# Patient Record
Sex: Female | Born: 1955
Health system: Southern US, Community
[De-identification: ages and names within clinical notes are randomized; demographics above are authoritative.]

## PROBLEM LIST (undated history)

## (undated) DIAGNOSIS — H43811 Vitreous degeneration, right eye: Secondary | ICD-10-CM

## (undated) DIAGNOSIS — T7840XA Allergy, unspecified, initial encounter: Secondary | ICD-10-CM

## (undated) DIAGNOSIS — E079 Disorder of thyroid, unspecified: Secondary | ICD-10-CM

## (undated) DIAGNOSIS — M25562 Pain in left knee: Secondary | ICD-10-CM

## (undated) HISTORY — DX: Vitreous degeneration, right eye: H43.811

## (undated) HISTORY — PX: FRACTURE SURGERY: SHX138

## (undated) HISTORY — DX: Pain in left knee: M25.562

## (undated) HISTORY — DX: Allergy, unspecified, initial encounter: T78.40XA

## (undated) HISTORY — DX: Disorder of thyroid, unspecified: E07.9

---

## 2004-07-15 ENCOUNTER — Ambulatory Visit: Payer: Self-pay | Admitting: Obstetrics and Gynecology

## 2005-07-04 ENCOUNTER — Ambulatory Visit: Payer: Self-pay | Admitting: Obstetrics and Gynecology

## 2006-08-23 ENCOUNTER — Ambulatory Visit: Payer: Self-pay | Admitting: Obstetrics and Gynecology

## 2006-10-05 ENCOUNTER — Ambulatory Visit: Payer: Self-pay | Admitting: Gastroenterology

## 2007-08-27 ENCOUNTER — Ambulatory Visit: Payer: Self-pay | Admitting: Obstetrics and Gynecology

## 2007-10-27 ENCOUNTER — Emergency Department (HOSPITAL_COMMUNITY): Admission: EM | Admit: 2007-10-27 | Discharge: 2007-10-27 | Payer: Self-pay | Admitting: Family Medicine

## 2008-08-27 ENCOUNTER — Ambulatory Visit: Payer: Self-pay | Admitting: Obstetrics and Gynecology

## 2009-04-28 ENCOUNTER — Other Ambulatory Visit: Admission: RE | Admit: 2009-04-28 | Discharge: 2009-04-28 | Payer: Self-pay | Admitting: Family Medicine

## 2010-07-27 ENCOUNTER — Ambulatory Visit: Payer: Self-pay | Admitting: Family Medicine

## 2011-08-01 ENCOUNTER — Ambulatory Visit: Payer: Self-pay | Admitting: Family Medicine

## 2012-03-14 ENCOUNTER — Other Ambulatory Visit (HOSPITAL_COMMUNITY)
Admission: RE | Admit: 2012-03-14 | Discharge: 2012-03-14 | Disposition: A | Discharge status: Home / Self Care | Payer: 59 | Source: Ambulatory Visit | Attending: Family Medicine | Admitting: Family Medicine

## 2012-03-14 DIAGNOSIS — Z1151 Encounter for screening for human papillomavirus (HPV): Secondary | ICD-10-CM | POA: Insufficient documentation

## 2012-03-14 DIAGNOSIS — Z124 Encounter for screening for malignant neoplasm of cervix: Secondary | ICD-10-CM | POA: Insufficient documentation

## 2012-08-01 ENCOUNTER — Ambulatory Visit: Payer: Self-pay | Admitting: Family Medicine

## 2013-03-10 ENCOUNTER — Ambulatory Visit: Payer: 59 | Admitting: Family Medicine

## 2013-03-13 ENCOUNTER — Ambulatory Visit (INDEPENDENT_AMBULATORY_CARE_PROVIDER_SITE_OTHER): Payer: 59 | Admitting: Internal Medicine

## 2013-03-13 ENCOUNTER — Other Ambulatory Visit (HOSPITAL_COMMUNITY)
Admission: RE | Admit: 2013-03-13 | Discharge: 2013-03-13 | Disposition: A | Discharge status: Home / Self Care | Payer: 59 | Source: Ambulatory Visit | Attending: Internal Medicine | Admitting: Internal Medicine

## 2013-03-13 ENCOUNTER — Encounter: Payer: Self-pay | Admitting: Internal Medicine

## 2013-03-13 ENCOUNTER — Other Ambulatory Visit: Payer: Self-pay | Admitting: Internal Medicine

## 2013-03-13 VITALS — BP 120/80 | HR 78 | Temp 98.4°F | Ht 67.0 in | Wt 173.5 lb

## 2013-03-13 DIAGNOSIS — E039 Hypothyroidism, unspecified: Secondary | ICD-10-CM

## 2013-03-13 DIAGNOSIS — Z01419 Encounter for gynecological examination (general) (routine) without abnormal findings: Secondary | ICD-10-CM | POA: Insufficient documentation

## 2013-03-13 DIAGNOSIS — Z0000 Encounter for general adult medical examination without abnormal findings: Secondary | ICD-10-CM

## 2013-03-13 DIAGNOSIS — Z124 Encounter for screening for malignant neoplasm of cervix: Secondary | ICD-10-CM

## 2013-03-13 DIAGNOSIS — Z13220 Encounter for screening for lipoid disorders: Secondary | ICD-10-CM

## 2013-03-13 DIAGNOSIS — Z131 Encounter for screening for diabetes mellitus: Secondary | ICD-10-CM

## 2013-03-13 DIAGNOSIS — M810 Age-related osteoporosis without current pathological fracture: Secondary | ICD-10-CM

## 2013-03-13 NOTE — Patient Instructions (Signed)

## 2013-03-13 NOTE — Progress Notes (Signed)
HPI  Pt presents to the clinic today to establish care. She is transferring care form [facility]. She has no concerns today.  Flu: never Tetanus: more than 10 years ago LMP: post menopausal Pap smear: 2011 Mammogram: 07/2012 Dexa Scan: 07/2012 Eye doctor: yearly Dentist: biannually  History reviewed. No pertinent past medical history.  Current Outpatient Prescriptions  Medication Sig Dispense Refill  . Ascorbic Acid (VITAMIN C) 1000 MG tablet Take 1,000 mg by mouth daily.      . cholecalciferol (VITAMIN D) 1000 UNITS tablet Take 1,000 Units by mouth daily.      . Coenzyme Q10 (COQ10) 100 MG CAPS Take 100 mg by mouth daily.      Marland Kitchen levothyroxine (SYNTHROID, LEVOTHROID) 100 MCG tablet Take 100 mcg by mouth. 1 tablet everyday and 1/2 tab on Sunday      . Omega-3 Fatty Acids (SUPER OMEGA 3 EPA/DHA) 1000 MG CAPS Take by mouth.      . raloxifene (EVISTA) 60 MG tablet Take 60 mg by mouth daily.       No current facility-administered medications for this visit.    Not on File  Family History  Problem Relation Age of Onset  . Diabetes Mother   . Cancer Father     prostate  . Cancer Sister     breast  . Stroke Brother     History   Social History  . Marital Status: Married    Spouse Name: N/A    Number of Children: N/A  . Years of Education: N/A   Occupational History  . Not on file.   Social History Main Topics  . Smoking status: Never Smoker   . Smokeless tobacco: Not on file  . Alcohol Use: No  . Drug Use: No  . Sexual Activity: Yes    Birth Control/ Protection: Post-menopausal   Other Topics Concern  . Not on file   Social History Narrative  . No narrative on file    ROS:  Constitutional: Denies fever, malaise, fatigue, headache or abrupt weight changes.  HEENT: Denies eye pain, eye redness, ear pain, ringing in the ears, wax buildup, runny nose, nasal congestion, bloody nose, or sore throat. Respiratory: Denies difficulty breathing, shortness of breath, cough  or sputum production.   Cardiovascular: Denies chest pain, chest tightness, palpitations or swelling in the hands or feet.  Gastrointestinal: Denies abdominal pain, bloating, constipation, diarrhea or blood in the stool.  GU: Denies frequency, urgency, pain with urination, blood in urine, odor or discharge. Musculoskeletal: Denies decrease in range of motion, difficulty with gait, muscle pain or joint pain and swelling.  Skin: Denies redness, rashes, lesions or ulcercations.  Neurological: Denies dizziness, difficulty with memory, difficulty with speech or problems with balance and coordination.   No other specific complaints in a complete review of systems (except as listed in HPI above).  PE:  BP 120/80  Pulse 78  Temp(Src) 98.4 F (36.9 C) (Oral)  Ht 5\' 7"  (1.702 m)  Wt 173 lb 8 oz (78.699 kg)  BMI 27.17 kg/m2  SpO2 98%  LMP 05/15/2005 Wt Readings from Last 3 Encounters:  03/13/13 173 lb 8 oz (78.699 kg)    General: Appears her stated age, well developed, well nourished in NAD. HEENT: Head: normal shape and size; Eyes: sclera white, no icterus, conjunctiva pink, PERRLA and EOMs intact; Ears: Tm's gray and intact, normal light reflex; Nose: mucosa pink and moist, septum midline; Throat/Mouth: Teeth present, mucosa pink and moist, no lesions or ulcerations noted.  Neck: Normal range of motion. Neck supple, trachea midline. No massses, lumps or thyromegaly present.  Cardiovascular: Normal rate and rhythm. S1,S2 noted.  No murmur, rubs or gallops noted. No JVD or BLE edema. No carotid bruits noted. Pulmonary/Chest: Normal effort and positive vesicular breath sounds. No respiratory distress. No wheezes, rales or ronchi noted.  Abdomen: Soft and nontender. Normal bowel sounds, no bruits noted. No distention or masses noted. Liver, spleen and kidneys non palpable. Musculoskeletal: Normal range of motion. No signs of joint swelling. No difficulty with gait.  Neurological: Alert and  oriented. Cranial nerves II-XII intact. Coordination normal. +DTRs bilaterally. Psychiatric: Mood and affect normal. Behavior is normal. Judgment and thought content normal.      Assessment and Plan:  Preventative Health:  Pt declines flu and Tdap today Will have labs done at [facility]- oredred today Pap smear obtained today  RTC in 1 year or sooner if needed

## 2013-03-17 ENCOUNTER — Telehealth: Payer: Self-pay

## 2013-03-17 NOTE — Telephone Encounter (Signed)
Message copied by Eulis Manly on Mon Mar 17, 2013  4:40 PM ------      Message from: Lorre Munroe      Created: Mon Mar 17, 2013  1:26 PM       Please call pt and let her know her pap was normal ------

## 2013-03-17 NOTE — Telephone Encounter (Signed)
Patient called and informed of test results. Patient stated that she was waiting on dates for lab work and she needed her levothyroxine refilled.   Please advise!   Thanks!

## 2013-03-18 ENCOUNTER — Other Ambulatory Visit: Payer: Self-pay | Admitting: *Deleted

## 2013-03-18 ENCOUNTER — Other Ambulatory Visit: Payer: Self-pay

## 2013-03-18 MED ORDER — LEVOTHYROXINE SODIUM 100 MCG PO TABS: ORAL_TABLET | ORAL | Status: DC

## 2013-03-18 NOTE — Telephone Encounter (Signed)
Mediatation refilled.

## 2013-03-18 NOTE — Telephone Encounter (Signed)
Ok to refill synthroid 

## 2013-04-30 ENCOUNTER — Encounter: Payer: Self-pay | Admitting: Internal Medicine

## 2013-04-30 ENCOUNTER — Encounter (HOSPITAL_COMMUNITY): Payer: Self-pay | Admitting: Emergency Medicine

## 2013-04-30 ENCOUNTER — Emergency Department (HOSPITAL_COMMUNITY)
Admission: EM | Admit: 2013-04-30 | Discharge: 2013-04-30 | Disposition: A | Discharge status: Home / Self Care | Payer: 59 | Source: Home / Self Care | Attending: Family Medicine | Admitting: Family Medicine

## 2013-04-30 ENCOUNTER — Emergency Department (INDEPENDENT_AMBULATORY_CARE_PROVIDER_SITE_OTHER): Payer: 59

## 2013-04-30 DIAGNOSIS — S62109A Fracture of unspecified carpal bone, unspecified wrist, initial encounter for closed fracture: Secondary | ICD-10-CM

## 2013-04-30 DIAGNOSIS — Y92009 Unspecified place in unspecified non-institutional (private) residence as the place of occurrence of the external cause: Secondary | ICD-10-CM

## 2013-04-30 DIAGNOSIS — S62101A Fracture of unspecified carpal bone, right wrist, initial encounter for closed fracture: Secondary | ICD-10-CM

## 2013-04-30 DIAGNOSIS — Y93E1 Activity, personal bathing and showering: Secondary | ICD-10-CM

## 2013-04-30 MED ORDER — HYDROCODONE-ACETAMINOPHEN 5-325 MG PO TABS: 1.0000 | ORAL_TABLET | Freq: Four times a day (QID) | ORAL | Status: DC | PRN

## 2013-04-30 NOTE — ED Notes (Signed)
C/o right wrist injury States she was getting out of the shower this afternoon and slipped when she fell back she tried to catch herself with her right hand States no treatment done Did put ice on wrist when she got here

## 2013-04-30 NOTE — ED Provider Notes (Signed)
CSN: [id number]     Arrival date & time 04/30/13  1309 History   None    Chief Complaint  Patient presents with  . Wrist Injury   (Consider location/radiation/quality/duration/timing/severity/associated sxs/prior Treatment) Patient is a 57 y.o. female presenting with hand injury. The history is provided by the patient.  Hand Injury Location:  Wrist Time since incident:  4 hours Injury: yes   Mechanism of injury: fall   Fall:    Fall occurred:  Standing (slipped getting out of the shower today)   Impact surface:  Hard floor Wrist location:  R wrist Pain details:    Quality:  Aching   Severity:  Mild Handedness:  Right-handed Prior injury to area:  No   History reviewed. No pertinent past medical history. History reviewed. No pertinent past surgical history. Family History  Problem Relation Age of Onset  . Diabetes Mother   . Cancer Father     prostate  . Cancer Sister     breast  . Stroke Brother    History  Substance Use Topics  . Smoking status: Never Smoker   . Smokeless tobacco: Not on file  . Alcohol Use: No   OB History   Grav Para Term Preterm Abortions TAB SAB Ect Mult Living                 Review of Systems  All other systems reviewed and are negative.    Allergies  Review of patient's allergies indicates no known allergies.  Home Medications   Current Outpatient Rx  Name  Route  Sig  Dispense  Refill  . Ascorbic Acid (VITAMIN C) 1000 MG tablet   Oral   Take 1,000 mg by mouth daily.         . cholecalciferol (VITAMIN D) 1000 UNITS tablet   Oral   Take 1,000 Units by mouth daily.         . Coenzyme Q10 (COQ10) 100 MG CAPS   Oral   Take 100 mg by mouth daily.         Marland Kitchen levothyroxine (SYNTHROID, LEVOTHROID) 100 MCG tablet      1 tablet everyday and 1/2 tab on Sunday   90 tablet   3   . Omega-3 Fatty Acids (SUPER OMEGA 3 EPA/DHA) 1000 MG CAPS   Oral   Take by mouth.         . raloxifene (EVISTA) 60 MG tablet   Oral  Take 60 mg by mouth daily.         Marland Kitchen HYDROcodone-acetaminophen (NORCO/VICODIN) 5-325 MG per tablet   Oral   Take 1-2 tablets by mouth every 6 (six) hours as needed for moderate pain or severe pain.   30 tablet   0    BP 158/91  Pulse 71  Temp(Src) 98.3 F (36.8 C) (Oral)  Resp 18  SpO2 100%  LMP 05/15/2005 Physical Exam  Nursing note and vitals reviewed. Constitutional: She is oriented to person, place, and time. She appears well-developed and well-nourished. No distress.  Cardiovascular: Normal rate, regular rhythm and normal heart sounds.   Pulmonary/Chest: Effort normal and breath sounds normal.  Musculoskeletal: She exhibits tenderness.       Right wrist: She exhibits decreased range of motion, bony tenderness, swelling and deformity.       Arms: Radial pulse at right wrist normal and cap refill at right hand/fingers normal.   Neurological: She is alert and oriented to person, place, and time.  Skin: Skin is  warm and dry.  Psychiatric: She has a normal mood and affect. Her behavior is normal.    ED Course  Procedures (including critical care time) Labs Review Labs Reviewed - No data to display Imaging Review Dg Wrist Complete Right  04/30/2013   CLINICAL DATA:  Fall.  EXAM: RIGHT WRIST - COMPLETE 3+ VIEW  COMPARISON:  None.  FINDINGS: There is an impacted fracture of the distal radial metaphysis. Apex volar angulation of the fracture fragments. Ulnar styloid avulsion fracture is seen in association. Advanced degenerative changes at the 1st carpometacarpal joint with mild changes at the scaphoid trapezial trapezoid joint. Scaphoid appears intact. Soft tissue swelling is noted.  IMPRESSION: 1. Impacted distal radius fracture. 2. Ulnar styloid avulsion fracture. 3. Degenerative changes in the wrist.   Electronically Signed   By: Leanna Battles M.D.   On: 04/30/2013 15:03    EKG Interpretation    Date/Time:    Ventricular Rate:    PR Interval:    QRS Duration:   QT  Interval:    QTC Calculation:   R Axis:     Text Interpretation:              MDM  Was informed by [facility] staff that Dr. Denyse Amass did attempt to have patient seen in office at [facility] today for injury, but was informed that there were no available appointments. Therefore, contacted Dr. Ronie Spies office following xray results. Advised by Dr. Ronie Spies PA Molly Maduro) to place patient in sugar tong splint with elevation and to instruct patient to follow up in Dr. Ronie Spies office tomorrow (05/01/2013) at 8:50am.  Asked to advise patient that she will likely require surgical repair on 05/02/2013.    Jess Barters [place], [place] 04/30/13 [address] [place], [place] 04/30/13 [phone]

## 2013-04-30 NOTE — Progress Notes (Signed)
Orthopedic Tech Progress Note Patient Details:  Sherri Lucas [birth year] [id number]  Ortho Devices Type of Ortho Device: Ace wrap;Sugartong splint Ortho Device/Splint Location: rue Ortho Device/Splint Interventions: Application Pt doesn't need arm sling as she has two at home ; rn notified  [name] 04/30/2013, 4:09 PM

## 2013-05-01 NOTE — ED Provider Notes (Signed)
Medical screening examination/treatment/procedure(s) were performed by resident physician or non-physician practitioner and as supervising physician I was immediately available for consultation/collaboration.   Barkley Bruns MD.   Linna Hoff, MD 05/01/13 1739

## 2013-06-26 ENCOUNTER — Other Ambulatory Visit: Payer: Self-pay | Admitting: *Deleted

## 2013-06-26 ENCOUNTER — Telehealth: Payer: Self-pay | Admitting: Internal Medicine

## 2013-06-26 DIAGNOSIS — Z1239 Encounter for other screening for malignant neoplasm of breast: Secondary | ICD-10-CM

## 2013-06-26 NOTE — Telephone Encounter (Signed)
Pt saw [name] 03/13/13 to est care while you were out on maternity leave. She now is requesting a mammogram. Please advise

## 2013-06-26 NOTE — Telephone Encounter (Signed)
Pt left voicemail with triage requesting refill on generic evista to [facility], pt saw [name] for a new pt appt on 03/13/13 due to Dr. Dayton MartesAron being out on maternity leave, pt request call back once Rx was sent

## 2013-06-26 NOTE — Telephone Encounter (Signed)
Referral placed.  Please call her to make sure she knows that she needs to call breast center directly to make appt.

## 2013-06-27 MED ORDER — RALOXIFENE HCL 60 MG PO TABS: 60.0000 mg | ORAL_TABLET | Freq: Every day | ORAL | Status: DC

## 2013-06-27 NOTE — Telephone Encounter (Signed)
Spoke to pt and advised. Pt states that she has scheduling information for her mammogram and will contact them directly

## 2013-06-27 NOTE — Telephone Encounter (Signed)
Ok to send in rx as pt requests.

## 2013-06-27 NOTE — Telephone Encounter (Signed)
Lm on pts vm informing her Rx has been sent to requested pharmacy 

## 2013-07-28 ENCOUNTER — Ambulatory Visit: Payer: Self-pay | Admitting: Family Medicine

## 2013-07-29 ENCOUNTER — Encounter: Payer: Self-pay | Admitting: Family Medicine

## 2013-12-18 ENCOUNTER — Other Ambulatory Visit: Payer: Self-pay | Admitting: Family Medicine

## 2014-02-26 ENCOUNTER — Telehealth: Payer: Self-pay

## 2014-02-26 DIAGNOSIS — Z01419 Encounter for gynecological examination (general) (routine) without abnormal findings: Secondary | ICD-10-CM

## 2014-02-26 NOTE — Telephone Encounter (Signed)
Yes she does need labs.  I can enter them in [facility]- [facility] can see those orders.  Orders entered.

## 2014-02-26 NOTE — Telephone Encounter (Signed)
Pt left v/m; pt has CPX scheduled 03/09/14 and wants to know if needs labs prior to appt; pt gets labs done at [facility]. If needs lab testing prior to appt; please print lab order and contact pt. Pt request cb.

## 2014-02-26 NOTE — Telephone Encounter (Signed)
Spoke to pt and advised per Dr Dayton MartesAron. Pt states that she will have labs drawn in time to be resulted and received for appt

## 2014-03-04 NOTE — Addendum Note (Signed)
Addended by: Alvina ChouWALSH, TERRI J on: 03/04/2014 09:36 AM   Modules accepted: Orders

## 2014-03-05 LAB — COMPREHENSIVE METABOLIC PANEL
A/G RATIO: 1.6 (ref 1.1–2.5)
ALT: 9 IU/L (ref 0–32)
AST: 19 IU/L (ref 0–40)
Albumin: 4.1 g/dL (ref 3.5–5.5)
Alkaline Phosphatase: 61 IU/L (ref 39–117)
BUN/Creatinine Ratio: 26 — ABNORMAL HIGH (ref 9–23)
BUN: 22 mg/dL (ref 6–24)
CALCIUM: 9.5 mg/dL (ref 8.7–10.2)
CO2: 28 mmol/L (ref 18–29)
Chloride: 100 mmol/L (ref 97–108)
Creatinine, Ser: 0.86 mg/dL (ref 0.57–1.00)
GFR calc Af Amer: 86 mL/min/{1.73_m2} (ref >59)
GFR, EST NON AFRICAN AMERICAN: 74 mL/min/{1.73_m2} (ref >59)
Globulin, Total: 2.6 g/dL (ref 1.5–4.5)
Glucose: 91 mg/dL (ref 65–99)
Potassium: 4.2 mmol/L (ref 3.5–5.2)
Sodium: 140 mmol/L (ref 134–144)
Total Bilirubin: 1 mg/dL (ref 0.0–1.2)
Total Protein: 6.7 g/dL (ref 6.0–8.5)

## 2014-03-05 LAB — CBC WITH DIFFERENTIAL/PLATELET
BASOS ABS: 0 10*3/uL (ref 0.0–0.2)
Basos: 1 %
EOS: 5 %
Eosinophils Absolute: 0.2 10*3/uL (ref 0.0–0.4)
HCT: 40.7 % (ref 34.0–46.6)
Hemoglobin: 13.6 g/dL (ref 11.1–15.9)
IMMATURE GRANS (ABS): 0 10*3/uL (ref 0.0–0.1)
IMMATURE GRANULOCYTES: 0 %
LYMPHS: 40 %
Lymphocytes Absolute: 1.5 10*3/uL (ref 0.7–3.1)
MCH: 31.3 pg (ref 26.6–33.0)
MCHC: 33.4 g/dL (ref 31.5–35.7)
MCV: 94 fL (ref 79–97)
MONOCYTES: 12 %
Monocytes Absolute: 0.5 10*3/uL (ref 0.1–0.9)
NEUTROS PCT: 42 %
Neutrophils Absolute: 1.6 10*3/uL (ref 1.4–7.0)
RBC: 4.34 x10E6/uL (ref 3.77–5.28)
RDW: 12.5 % (ref 12.3–15.4)
WBC: 3.7 10*3/uL (ref 3.4–10.8)

## 2014-03-05 LAB — VITAMIN D 25 HYDROXY (VIT D DEFICIENCY, FRACTURES): Vit D, 25-Hydroxy: 33.5 ng/mL (ref 30.0–100.0)

## 2014-03-05 LAB — TSH: TSH: 4.12 u[IU]/mL (ref 0.450–4.500)

## 2014-03-05 LAB — LIPID PANEL
Chol/HDL Ratio: 2.5 ratio units (ref 0.0–4.4)
Cholesterol, Total: 184 mg/dL (ref 100–199)
HDL: 73 mg/dL (ref >39)
LDL Calculated: 97 mg/dL (ref 0–99)
TRIGLYCERIDES: 68 mg/dL (ref 0–149)
VLDL CHOLESTEROL CAL: 14 mg/dL (ref 5–40)

## 2014-03-09 ENCOUNTER — Encounter: Payer: Self-pay | Admitting: Family Medicine

## 2014-03-09 ENCOUNTER — Ambulatory Visit (INDEPENDENT_AMBULATORY_CARE_PROVIDER_SITE_OTHER): Payer: 59 | Admitting: Family Medicine

## 2014-03-09 VITALS — BP 116/70 | HR 67 | Temp 98.0°F | Ht 66.25 in | Wt 170.5 lb

## 2014-03-09 DIAGNOSIS — Z01419 Encounter for gynecological examination (general) (routine) without abnormal findings: Secondary | ICD-10-CM

## 2014-03-09 DIAGNOSIS — E038 Other specified hypothyroidism: Secondary | ICD-10-CM

## 2014-03-09 DIAGNOSIS — Z0000 Encounter for general adult medical examination without abnormal findings: Secondary | ICD-10-CM

## 2014-03-09 DIAGNOSIS — M810 Age-related osteoporosis without current pathological fracture: Secondary | ICD-10-CM

## 2014-03-09 DIAGNOSIS — Z1211 Encounter for screening for malignant neoplasm of colon: Secondary | ICD-10-CM

## 2014-03-09 DIAGNOSIS — E039 Hypothyroidism, unspecified: Secondary | ICD-10-CM | POA: Insufficient documentation

## 2014-03-09 IMAGING — MG MM CAD SCREENING MAMMO
1 series · 6 of 6 positions shown · non-contrast
Comparison: none

REASON FOR EXAM: SCR MAMMO NO ORDER
COMMENTS:

PROCEDURE:     MAM - MAM DGTL SCRN MAM NO ORDER W/CAD  - August 01, 2012 [DATE]
RESULT:     COMPARISON:  08/01/2011, 08/27/2008, 08/23/2006
TECHNIQUE: Digital screening mammograms were obtained. FDA approved
computer-aided detection (CAD) for mammography was utilized for this study.
BREAST COMPOSITION: The breast composition is HETEROGENEOUSLY DENSE
(glandular tissue is 51-75%). This may decrease the sensitivity of
mammography.

[R CC · right · 6 of 6 slices shown]
[im 1/6]
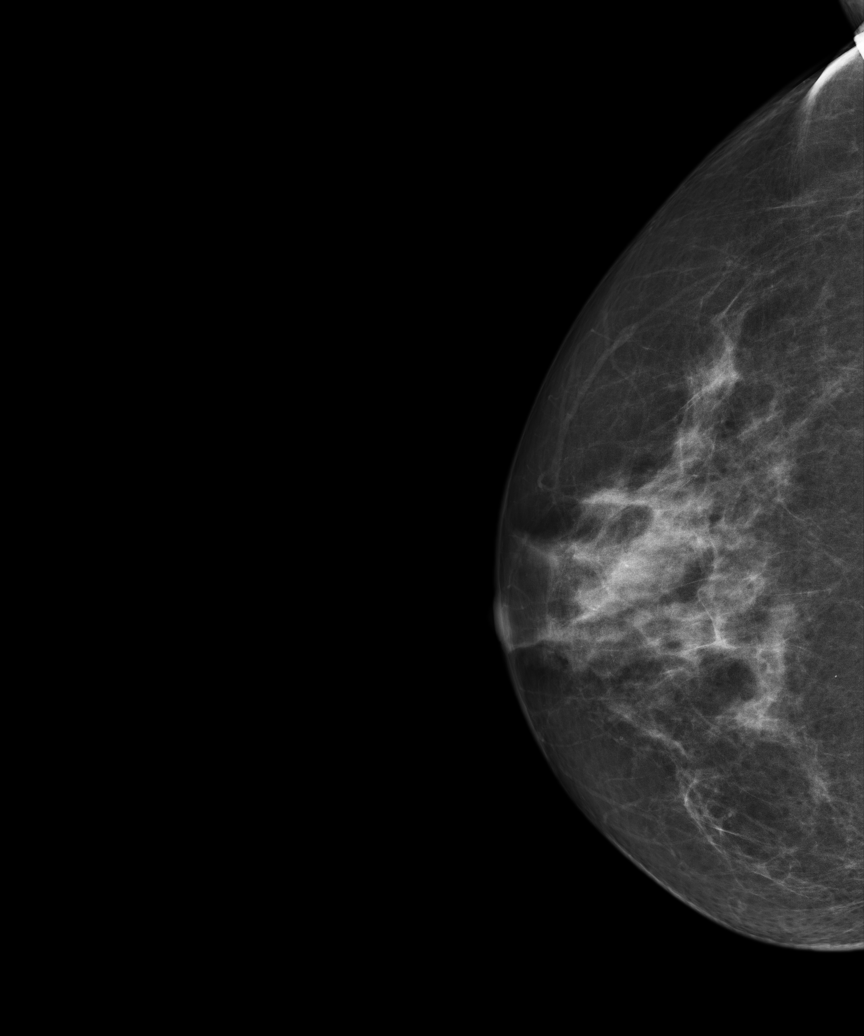
[im 2/6]
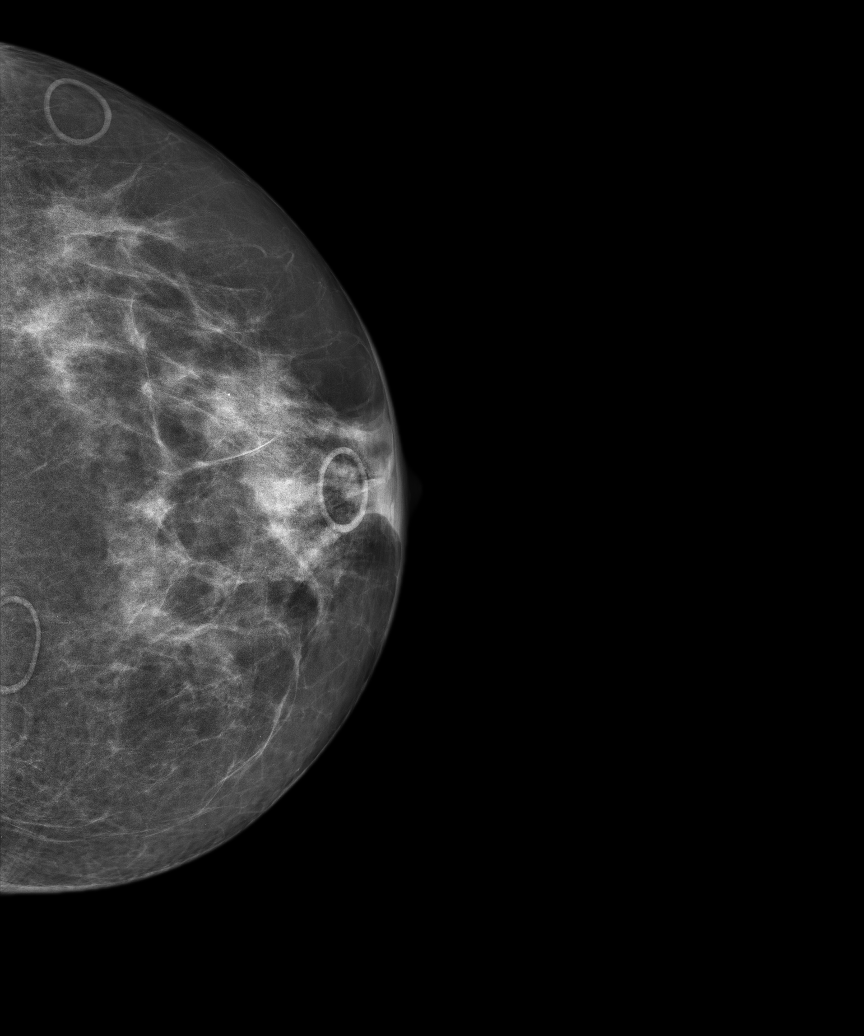
[im 3/6]
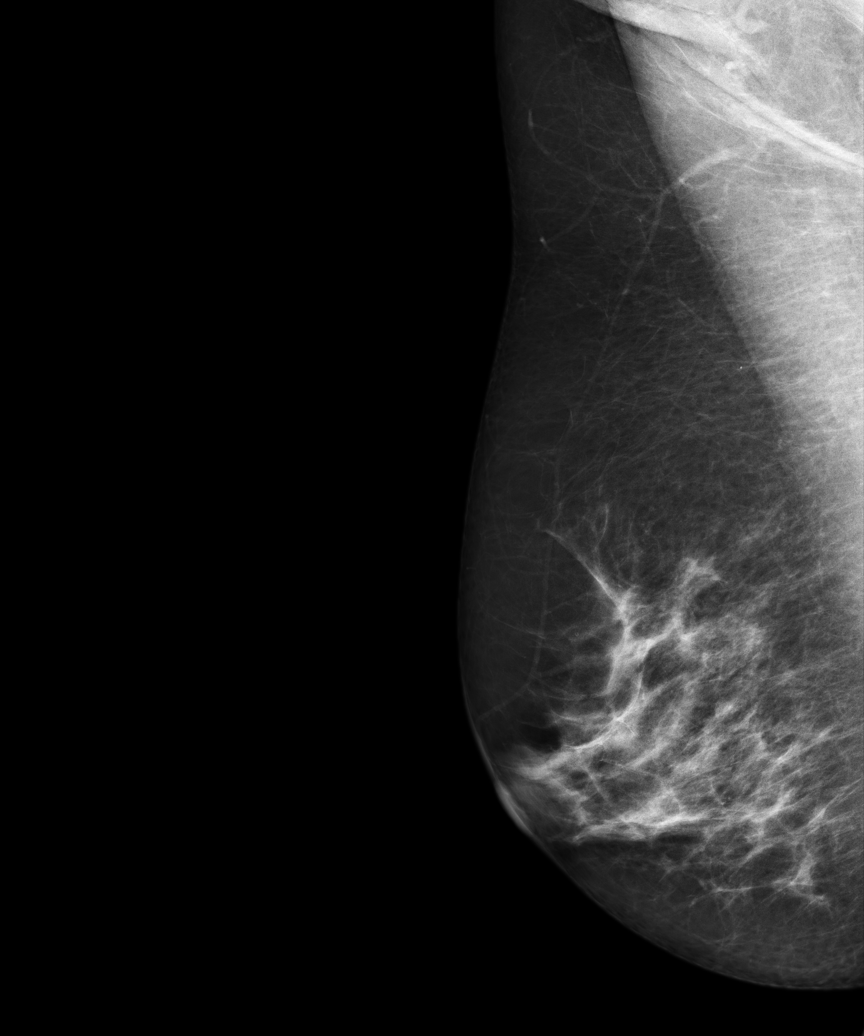
[im 4/6]
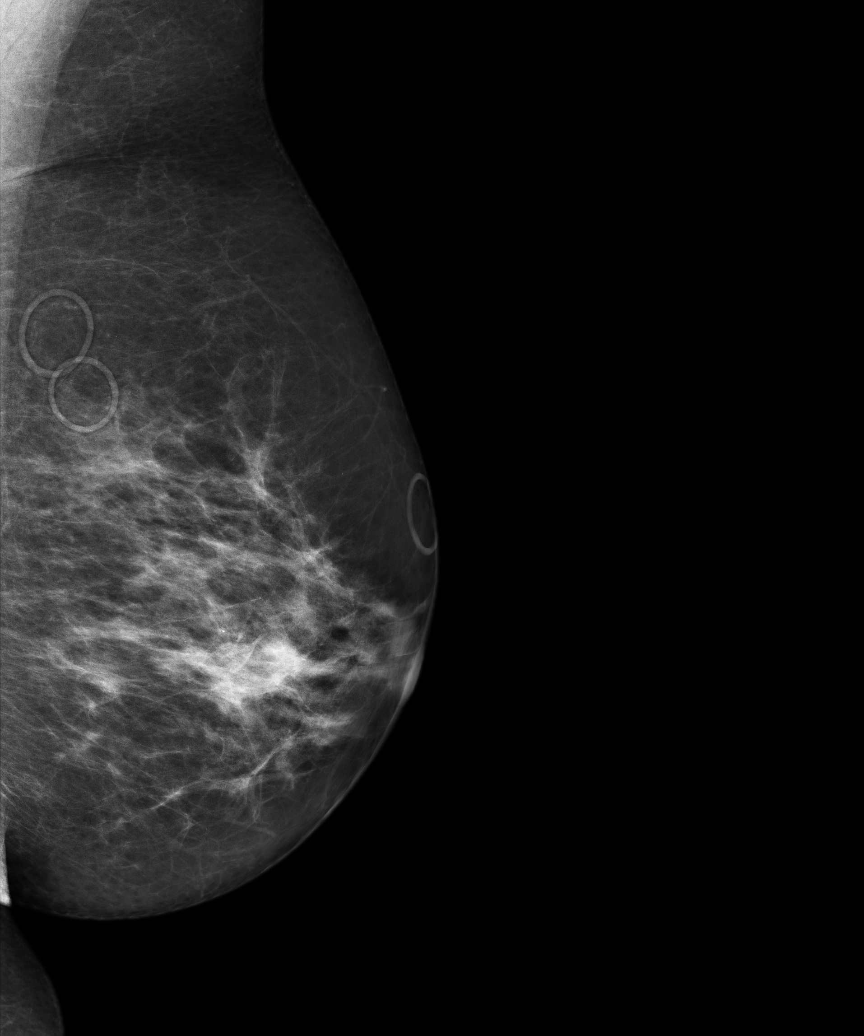
[im 5/6]
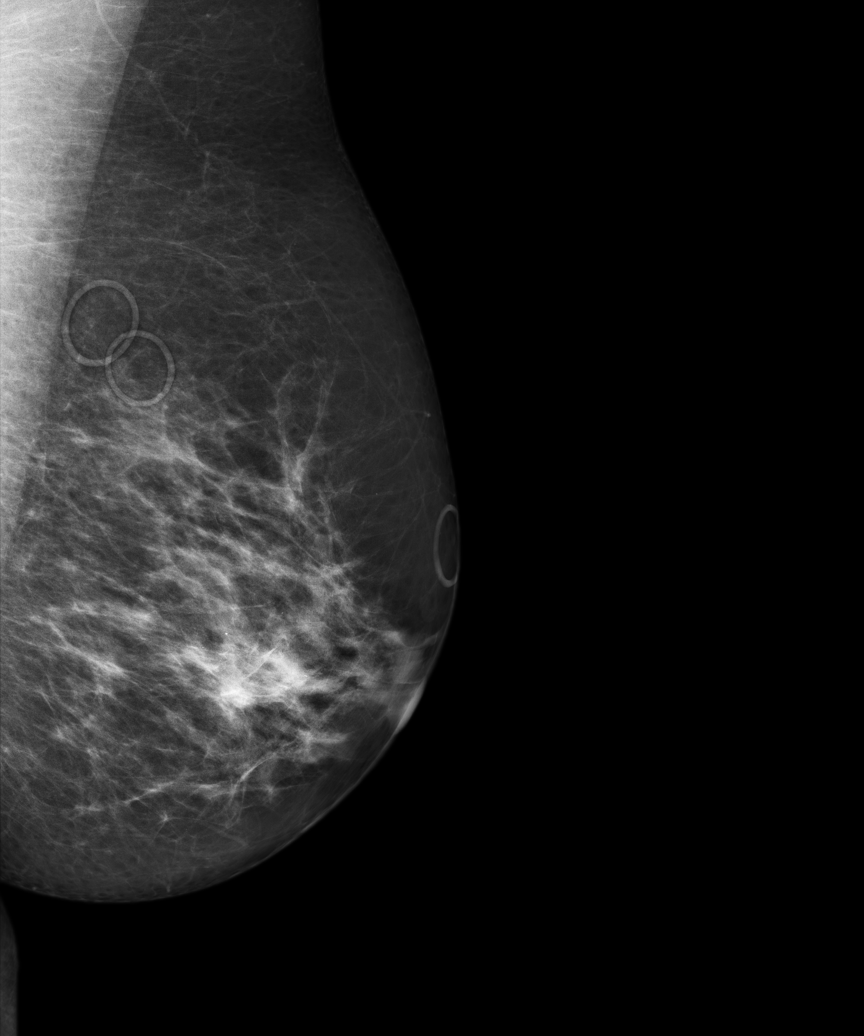
[im 6/6]
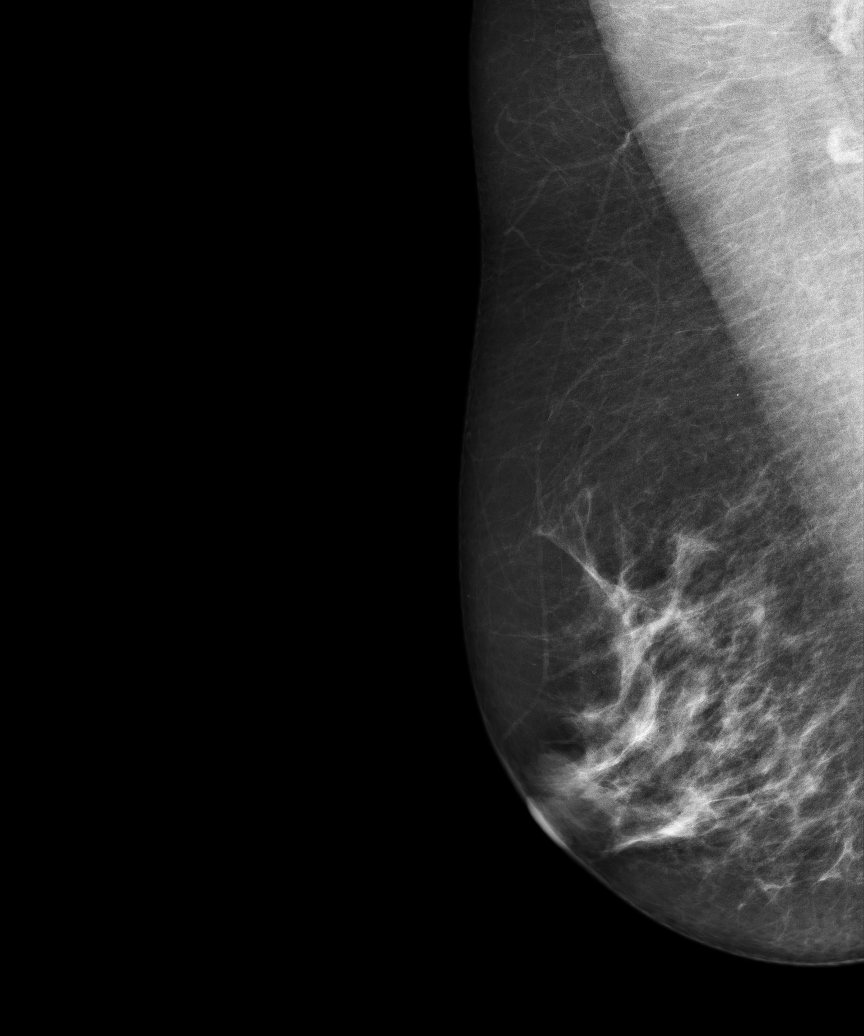

[6 of 6 positions shown; findings below may reference images not displayed]

FINDING: There is no dominant mass, architectural distortion or clusters of
suspicious microcalcifications.
IMPRESSION: 1.     Stable bilateral mammogram.
2.     Annual mammographic follow up recommended.

BI-RADS:  Category 2- Benign.

A negative mammogram report does not preclude biopsy or other evaluation of
a clinically palpable or otherwise suspicious mass or lesion. Breast cancer
may not be detected by mammography in up to 10% of cases.

[REDACTED]

## 2014-03-09 NOTE — Assessment & Plan Note (Signed)
On Evista. Due for DEXA next year.

## 2014-03-09 NOTE — Progress Notes (Signed)
Subjective:    Patient ID: Sherri Lucas, female    DOB: 04/22/1956, 58 y.o.   MRN: [id number]  HPI  58 yo pleasant female new to me (established with Sherri Lucas last fall) here for CPX.  Doing well- just started 2nd shift at work.  Feels she is adjusting ok to her new schedule.  Tdap 03/09/14 Mammogram 07/28/13 Pap [facility] 03/17/13 [facility] DEXA 07/2012 Defers flu shot Colonoscopy- per pt, due next year.  Hypothyroidism- has been on synthroid 100 mcg daily for years.  Denies any symptoms of hypo or hyperthyroidism.  Lab Results  Component Value Date   TSH 4.120 03/04/2014    Osteoporosis- DEXA up today.  Takes Evista.  Never took fosamax or other bisphosphates- "I heard bad things about it."  Lab Results  Component Value Date   HDL 73 03/04/2014   LDLCALC 97 03/04/2014   TRIG 68 03/04/2014   CHOLHDL 2.5 03/04/2014   Lab Results  Component Value Date   WBC 3.7 03/04/2014   HGB 13.6 03/04/2014   HCT 40.7 03/04/2014   MCV 94 03/04/2014   Lab Results  Component Value Date   CREATININE 0.86 03/04/2014    Current Outpatient Prescriptions on File Prior to Visit  Medication Sig Dispense Refill  . Ascorbic Acid (VITAMIN C) 1000 MG tablet Take 1,000 mg by mouth daily.      . cholecalciferol (VITAMIN D) 1000 UNITS tablet Take 1,000 Units by mouth daily.      . Coenzyme Q10 (COQ10) 100 MG CAPS Take 100 mg by mouth daily.      Marland Kitchen. levothyroxine (SYNTHROID, LEVOTHROID) 100 MCG tablet 1 tablet everyday and 1/2 tab on Sunday  90 tablet  3  . Omega-3 Fatty Acids (SUPER OMEGA 3 EPA/DHA) 1000 MG CAPS Take by mouth.      . raloxifene (EVISTA) 60 MG tablet Take 1 tablet by mouth  daily  90 tablet  0   No current facility-administered medications on file prior to visit.    No Known Allergies  No past medical history on file.  No past surgical history on file.  Family History  Problem Relation Age of Onset  . Diabetes Mother   . Cancer Father     prostate    . Cancer Sister     breast  . Stroke Brother     History   Social History  . Marital Status: Married    Spouse Name: N/A    Number of Children: N/A  . Years of Education: N/A   Occupational History  . Not on file.   Social History Main Topics  . Smoking status: Never Smoker   . Smokeless tobacco: Not on file  . Alcohol Use: No  . Drug Use: No  . Sexual Activity: Yes    Birth Control/ Protection: Post-menopausal   Other Topics Concern  . Not on file   Social History Narrative  . No narrative on file   The PMH, PSH, Social History, Family History, Medications, and allergies [facility], and have been updated if relevant.   Review of Systems See HPI Patient reports no  vision/ hearing changes,anorexia, weight change, fever ,adenopathy, persistant / recurrent hoarseness, swallowing issues, chest pain, edema,persistant / recurrent cough, hemoptysis, dyspnea(rest, exertional, paroxysmal nocturnal), gastrointestinal  bleeding (melena, rectal bleeding), abdominal pain, excessive heart burn, GU symptoms(dysuria, hematuria, pyuria, voiding/incontinence  Issues) syncope, focal weakness, severe memory loss, concerning skin lesions, depression, anxiety, abnormal bruising/bleeding, major joint swelling,  breast masses or abnormal vaginal bleeding.       Objective:   Physical Exam BP 116/70  Pulse 67  Temp(Src) 98 F (36.7 C) (Oral)  Ht 5' 6.25" (1.683 m)  Wt 170 lb 8 oz (77.338 kg)  BMI 27.30 kg/m2  SpO2 97%  LMP 05/15/2005   General:  Well-developed,well-nourished,in no acute distress; alert,appropriate and cooperative throughout examination Head:  normocephalic and atraumatic.   Eyes:  vision grossly intact, pupils equal, pupils round, and pupils reactive to light.   Ears:  R ear normal and L ear normal.   Nose:  no external deformity.   Mouth:  good dentition.   Neck:  No deformities, masses, or tenderness noted. Breasts:  No mass, nodules, thickening,  tenderness, bulging, retraction, inflamation, nipple discharge or skin changes noted.   Lungs:  Normal respiratory effort, chest expands symmetrically. Lungs are clear to auscultation, no crackles or wheezes. Heart:  Normal rate and regular rhythm. S1 and S2 normal without gallop, murmur, click, rub or other extra sounds. Abdomen:  Bowel sounds positive,abdomen soft and non-tender without masses, organomegaly or hernias noted. Msk:  No deformity or scoliosis noted of thoracic or lumbar spine.   Extremities:  No clubbing, cyanosis, edema, or deformity noted with normal full range of motion of all joints.   Neurologic:  alert & oriented X3 and gait normal.   Skin:  Intact without suspicious lesions or rashes Cervical Nodes:  No lymphadenopathy noted Axillary Nodes:  No palpable lymphadenopathy Psych:  Cognition and judgment appear intact. Alert and cooperative with normal attention span and concentration. No apparent delusions, illusions, hallucinations       Assessment & Plan:

## 2014-03-09 NOTE — Progress Notes (Signed)
Pre visit review using our clinic review tool, if applicable. No additional management support is needed unless otherwise documented below in the visit note. 

## 2014-03-09 NOTE — Assessment & Plan Note (Signed)
Well controlled on current rx. TSH a little high but normal FT4 and she is asymptomatic. No changes made.

## 2014-03-09 NOTE — Assessment & Plan Note (Signed)
Reviewed preventive care protocols, scheduled due services, and updated immunizations Discussed nutrition, exercise, diet, and healthy lifestyle.  Declines influenza vaccine. [facility].  Per pt, will be due soon.

## 2014-03-09 NOTE — Patient Instructions (Signed)
Great to meet you. Your labs look fantastic.

## 2014-03-26 ENCOUNTER — Other Ambulatory Visit: Payer: Self-pay | Admitting: Internal Medicine

## 2014-03-26 ENCOUNTER — Other Ambulatory Visit: Payer: Self-pay | Admitting: Family Medicine

## 2014-04-07 NOTE — Telephone Encounter (Signed)
Pt called and request [facility] to [facility] [phone] option 1 to find out why [facility] is not [facility] out her med; [facility] [place] [facility] and she needed collaberating physician for Nicki Reaperegina Baity NP; advised Dr Ruthe Mannanalia Aron; shada will process rx and pt notified.

## 2014-04-08 MED ORDER — LEVOTHYROXINE SODIUM 100 MCG PO TABS: 100.0000 ug | ORAL_TABLET | Freq: Every day | ORAL | Status: DC

## 2014-04-08 NOTE — Addendum Note (Signed)
Addended by: Roena MaladyEVONTENNO, Winfred Redel Y on: 04/08/2014 09:50 AM   Modules accepted: Orders

## 2014-04-08 NOTE — Telephone Encounter (Signed)
Rx recent under Dr Elmer SowAron's name as i [facility] requesting for supervising physician' signature--So i sent in under PCP name

## 2014-07-30 ENCOUNTER — Ambulatory Visit: Payer: Self-pay | Admitting: Family Medicine

## 2014-07-30 ENCOUNTER — Encounter: Payer: Self-pay | Admitting: Family Medicine

## 2014-08-10 ENCOUNTER — Other Ambulatory Visit: Payer: Self-pay | Admitting: Family Medicine

## 2015-01-25 ENCOUNTER — Telehealth: Payer: Self-pay | Admitting: Family Medicine

## 2015-01-25 NOTE — Telephone Encounter (Signed)
Please advise 

## 2015-01-25 NOTE — Telephone Encounter (Signed)
Orders written and on my desk. 

## 2015-01-25 NOTE — Telephone Encounter (Signed)
Pt made appt for cpe, works at [facility], would like orders written. Please call when ready to pick up [phone] Thanks

## 2015-01-25 NOTE — Telephone Encounter (Signed)
Patient notified

## 2015-03-02 ENCOUNTER — Other Ambulatory Visit: Payer: Self-pay | Admitting: Family Medicine

## 2015-03-03 LAB — CBC WITH DIFFERENTIAL/PLATELET
BASOS: 1 %
Basophils Absolute: 0 10*3/uL (ref 0.0–0.2)
EOS (ABSOLUTE): 0.2 10*3/uL (ref 0.0–0.4)
Eos: 5 %
Hematocrit: 40.1 % (ref 34.0–46.6)
Hemoglobin: 13.6 g/dL (ref 11.1–15.9)
IMMATURE GRANS (ABS): 0 10*3/uL (ref 0.0–0.1)
Immature Granulocytes: 0 %
LYMPHS: 38 %
Lymphocytes Absolute: 1.7 10*3/uL (ref 0.7–3.1)
MCH: 31.9 pg (ref 26.6–33.0)
MCHC: 33.9 g/dL (ref 31.5–35.7)
MCV: 94 fL (ref 79–97)
Monocytes Absolute: 0.6 10*3/uL (ref 0.1–0.9)
Monocytes: 13 %
NEUTROS ABS: 2 10*3/uL (ref 1.4–7.0)
NEUTROS PCT: 43 %
PLATELETS: 242 10*3/uL (ref 150–379)
RBC: 4.26 x10E6/uL (ref 3.77–5.28)
RDW: 12.6 % (ref 12.3–15.4)
WBC: 4.5 10*3/uL (ref 3.4–10.8)

## 2015-03-03 LAB — COMPREHENSIVE METABOLIC PANEL
A/G RATIO: 1.7 (ref 1.1–2.5)
ALBUMIN: 4.2 g/dL (ref 3.6–4.8)
ALT: 9 IU/L (ref 0–32)
AST: 18 IU/L (ref 0–40)
Alkaline Phosphatase: 66 IU/L (ref 39–117)
BILIRUBIN TOTAL: 1 mg/dL (ref 0.0–1.2)
BUN / CREAT RATIO: 18 (ref 11–26)
BUN: 17 mg/dL (ref 8–27)
CHLORIDE: 101 mmol/L (ref 97–106)
CO2: 24 mmol/L (ref 18–29)
Calcium: 9.5 mg/dL (ref 8.7–10.3)
Creatinine, Ser: 0.92 mg/dL (ref 0.57–1.00)
GFR calc non Af Amer: 68 mL/min/{1.73_m2} (ref >59)
GFR, EST AFRICAN AMERICAN: 78 mL/min/{1.73_m2} (ref >59)
GLOBULIN, TOTAL: 2.5 g/dL (ref 1.5–4.5)
Glucose: 94 mg/dL (ref 65–99)
POTASSIUM: 4.4 mmol/L (ref 3.5–5.2)
SODIUM: 141 mmol/L (ref 136–144)
TOTAL PROTEIN: 6.7 g/dL (ref 6.0–8.5)

## 2015-03-03 LAB — LIPID PANEL W/O CHOL/HDL RATIO
Cholesterol, Total: 189 mg/dL (ref 100–199)
HDL: 72 mg/dL (ref >39)
LDL Calculated: 96 mg/dL (ref 0–99)
Triglycerides: 105 mg/dL (ref 0–149)
VLDL Cholesterol Cal: 21 mg/dL (ref 5–40)

## 2015-03-03 LAB — VITAMIN D 25 HYDROXY (VIT D DEFICIENCY, FRACTURES): VIT D 25 HYDROXY: 31 ng/mL (ref 30.0–100.0)

## 2015-03-03 LAB — T4, FREE: Free T4: 1.49 ng/dL (ref 0.82–1.77)

## 2015-03-03 LAB — TSH: TSH: 8.05 u[IU]/mL — AB (ref 0.450–4.500)

## 2015-03-03 LAB — VITAMIN B12: VITAMIN B 12: 310 pg/mL (ref 211–946)

## 2015-03-10 ENCOUNTER — Encounter: Payer: Self-pay | Admitting: Family Medicine

## 2015-03-10 ENCOUNTER — Ambulatory Visit (INDEPENDENT_AMBULATORY_CARE_PROVIDER_SITE_OTHER): Payer: 59 | Admitting: Family Medicine

## 2015-03-10 VITALS — BP 118/62 | HR 73 | Temp 97.6°F | Ht 66.75 in | Wt 171.5 lb

## 2015-03-10 DIAGNOSIS — Z01419 Encounter for gynecological examination (general) (routine) without abnormal findings: Secondary | ICD-10-CM

## 2015-03-10 DIAGNOSIS — Z0000 Encounter for general adult medical examination without abnormal findings: Secondary | ICD-10-CM | POA: Diagnosis not present

## 2015-03-10 DIAGNOSIS — E038 Other specified hypothyroidism: Secondary | ICD-10-CM

## 2015-03-10 DIAGNOSIS — M810 Age-related osteoporosis without current pathological fracture: Secondary | ICD-10-CM

## 2015-03-10 MED ORDER — LEVOTHYROXINE SODIUM 112 MCG PO TABS: 112.0000 ug | ORAL_TABLET | Freq: Every day | ORAL | Status: DC

## 2015-03-10 NOTE — Assessment & Plan Note (Signed)
TSH elevated- appears undercorrected. Increase synthroid to 112 mcg daily, rx given to pt to recheck TSH, FT4 in 4-8 weeks at work. The patient indicates understanding of these issues and agrees with the plan.

## 2015-03-10 NOTE — Patient Instructions (Addendum)
We are increasing your synthroid to 112 mcg daily. Please have your labs recheck 4-8 weeks after you start the higher dose.  Please call your gastroenterologist to schedule your colonoscopy.

## 2015-03-10 NOTE — Assessment & Plan Note (Signed)
Due for dexa- order placed Orders Placed This Encounter  Procedures  . DG Bone Density Peripheral Skeleton

## 2015-03-10 NOTE — Assessment & Plan Note (Signed)
Reviewed preventive care protocols, scheduled due services, and updated immunizations Discussed nutrition, exercise, diet, and healthy lifestyle.  

## 2015-03-10 NOTE — Progress Notes (Signed)
Subjective:    Patient ID: Sherri Lucas, female    DOB: 09/01/1955, 59 y.o.   MRN: [id number]  HPI  59 yo pleasant female  here for CPX and follow up of chronic medical conditions.  Doing well-  Tdap 03/09/14 Mammogram 07/30/14 [facility]  LMP 2007- no h/o post menopausal bleeding DEXA 07/2012 Defers flu shot Colonoscopy- she is due  Hypothyroidism- has been on synthroid 100 mcg daily for years.  TSH is elevated this month. Denies any symptoms of hypo or hyperthyroidism.  Lab Results  Component Value Date   TSH 8.050* 03/02/2015    Osteoporosis-  Takes Evista.  Due for DEXA.  Never took fosamax or other bisphosphates- "I heard bad things about it."  Lab Results  Component Value Date   CHOL 189 03/02/2015   HDL 72 03/02/2015   LDLCALC 96 03/02/2015   TRIG 105 03/02/2015   CHOLHDL 2.5 03/04/2014   Lab Results  Component Value Date   WBC 4.5 03/02/2015   HGB 13.6 03/04/2014   HCT 40.1 03/02/2015   MCV 94 03/04/2014   Lab Results  Component Value Date   CREATININE 0.92 03/02/2015    Current Outpatient Prescriptions on File Prior to Visit  Medication Sig Dispense Refill  . Ascorbic Acid (VITAMIN C) 1000 MG tablet Take 1,000 mg by mouth daily.    . cholecalciferol (VITAMIN D) 1000 UNITS tablet Take 1,000 Units by mouth daily.    . Coenzyme Q10 (COQ10) 100 MG CAPS Take 100 mg by mouth daily.    Marland Kitchen. levothyroxine (SYNTHROID, LEVOTHROID) 100 MCG tablet Take 1 tablet (100 mcg total) by mouth daily before breakfast. 90 tablet 3  . Omega-3 Fatty Acids (SUPER OMEGA 3 EPA/DHA) 1000 MG CAPS Take by mouth.    . raloxifene (EVISTA) 60 MG tablet Take 1 tablet by mouth  daily 90 tablet 1   No current facility-administered medications on file prior to visit.    No Known Allergies  No past medical history on file.  No past surgical history on file.  Family History  Problem Relation Age of Onset  . Diabetes Mother   . Cancer Father    prostate  . Cancer Sister     breast  . Stroke Brother     Social History   Social History  . Marital Status: Married    Spouse Name: N/A  . Number of Children: N/A  . Years of Education: N/A   Occupational History  . Not on file.   Social History Main Topics  . Smoking status: Never Smoker   . Smokeless tobacco: Not on file  . Alcohol Use: No  . Drug Use: No  . Sexual Activity: Yes    Birth Control/ Protection: Post-menopausal   Other Topics Concern  . Not on file   Social History Narrative   The PMH, PSH, Social History, Family History, Medications, and allergies [facility], and have been updated if relevant.   Review of Systems  Constitutional: Negative.   HENT: Negative.   Eyes: Negative.   Respiratory: Negative.   Cardiovascular: Negative.   Gastrointestinal: Negative.   Endocrine: Negative.   Genitourinary: Negative.   Musculoskeletal: Negative.   Skin: Negative.   Allergic/Immunologic: Negative.   Neurological: Negative.   Hematological: Negative.   Psychiatric/Behavioral: Negative.   All other systems reviewed and are negative.      Objective:   Physical Exam BP 118/62 mmHg  Pulse 73  Temp(Src)  97.6 F (36.4 C) (Oral)  Ht 5' 6.75" (1.695 m)  Wt 171 lb 8 oz (77.792 kg)  BMI 27.08 kg/m2  SpO2 99%  LMP 05/15/2005   General:  Well-developed,well-nourished,in no acute distress; alert,appropriate and cooperative throughout examination Head:  normocephalic and atraumatic.   Eyes:  vision grossly intact, pupils equal, pupils round, and pupils reactive to light.   Ears:  R ear normal and L ear normal.   Nose:  no external deformity.   Mouth:  good dentition.   Neck:  No deformities, masses, or tenderness noted. Breasts:  No mass, nodules, thickening, tenderness, bulging, retraction, inflamation, nipple discharge or skin changes noted.   Lungs:  Normal respiratory effort, chest expands symmetrically. Lungs are clear to  auscultation, no crackles or wheezes. Heart:  Normal rate and regular rhythm. S1 and S2 normal without gallop, murmur, click, rub or other extra sounds. Abdomen:  Bowel sounds positive,abdomen soft and non-tender without masses, organomegaly or hernias noted. Msk:  No deformity or scoliosis noted of thoracic or lumbar spine.   Extremities:  No clubbing, cyanosis, edema, or deformity noted with normal full range of motion of all joints.   Neurologic:  alert & oriented X3 and gait normal.   Skin:  Intact without suspicious lesions or rashes Cervical Nodes:  No lymphadenopathy noted Axillary Nodes:  No palpable lymphadenopathy Psych:  Cognition and judgment appear intact. Alert and cooperative with normal attention span and concentration. No apparent delusions, illusions, hallucinations       Assessment & Plan:

## 2015-03-10 NOTE — Progress Notes (Signed)
Pre visit review using our clinic review tool, if applicable. No additional management support is needed unless otherwise documented below in the visit note. 

## 2015-03-11 ENCOUNTER — Telehealth: Payer: Self-pay | Admitting: Family Medicine

## 2015-03-11 DIAGNOSIS — M949 Disorder of cartilage, unspecified: Principal | ICD-10-CM

## 2015-03-11 DIAGNOSIS — M899 Disorder of bone, unspecified: Secondary | ICD-10-CM

## 2015-03-11 NOTE — Telephone Encounter (Signed)
i called [facility] to make bone density appointment.   You put order in for dg bone density peripheral skeleton.  Is this a special test or do you want regular bone density. If you want regular bone density can you change order thanks

## 2015-03-11 NOTE — Telephone Encounter (Signed)
I apologize for the error.  New order placed.

## 2015-03-11 NOTE — Telephone Encounter (Signed)
Appointment 11/10 [facility] Pt aware

## 2015-03-25 ENCOUNTER — Ambulatory Visit
Admission: RE | Admit: 2015-03-25 | Discharge: 2015-03-25 | Disposition: A | Discharge status: Home / Self Care | Payer: 59 | Source: Ambulatory Visit | Attending: Family Medicine | Admitting: Family Medicine

## 2015-03-25 DIAGNOSIS — M949 Disorder of cartilage, unspecified: Secondary | ICD-10-CM

## 2015-03-25 DIAGNOSIS — M8580 Other specified disorders of bone density and structure, unspecified site: Secondary | ICD-10-CM | POA: Diagnosis not present

## 2015-03-25 DIAGNOSIS — M899 Disorder of bone, unspecified: Secondary | ICD-10-CM

## 2015-03-30 ENCOUNTER — Ambulatory Visit (INDEPENDENT_AMBULATORY_CARE_PROVIDER_SITE_OTHER): Payer: 59 | Admitting: Family Medicine

## 2015-03-30 ENCOUNTER — Encounter: Payer: Self-pay | Admitting: Family Medicine

## 2015-03-30 VITALS — BP 94/58 | HR 68 | Temp 97.8°F | Wt 172.5 lb

## 2015-03-30 DIAGNOSIS — M810 Age-related osteoporosis without current pathological fracture: Secondary | ICD-10-CM | POA: Diagnosis not present

## 2015-03-30 NOTE — Assessment & Plan Note (Signed)
Deteriorated. >15 minutes spent in face to face time with patient, >50% spent in counselling or coordination of care Discussed tx options including reclast, prolia or even fosamax and d/c evista. She would like to talk with her sister about it and get back to me.

## 2015-03-30 NOTE — Progress Notes (Signed)
Pre visit review using our clinic review tool, if applicable. No additional management support is needed unless otherwise documented below in the visit note. 

## 2015-03-30 NOTE — Progress Notes (Signed)
Subjective:   Patient ID: Sherri Foersteratricia M Vaness, female    DOB: 02/27/1956, 59 y.o.   MRN: [id number]  Sherri Lucas is a pleasant 59 y.o. year old female who presents to clinic today with Follow-up  on 03/30/2015  HPI:  Osteoporosis-  Has been taking Evista for years.  Never took rxs like fosamax or boniva.  Asked to start Evista because of the side effects of the others (her sister is a Teacher, early years/prepharmacist).  Most recent bone density shows deteriorating bone mass- now osteoporosis compared to osteopenia from DEXA done on 05/03/13.  Dg Bone Density  03/25/2015  EXAM: DUAL X-RAY ABSORPTIOMETRY (DXA) FOR BONE MINERAL DENSITY IMPRESSION: Dear Dr. Dayton MartesAron, Your patient Lebron Quamatricia Hoagland completed a BMD test on 03/25/2015 using the Lunar iDXA DXA System (analysis version: 14.10) manufactured by [employer]. The following summarizes the results of our evaluation. PATIENT BIOGRAPHICAL: Name: Sherri FoersterHouser, Elesa M Patient ID: [id number] Birth Date: 1955/10/09 Height: 66.5 in. Gender: Female Exam Date: 03/25/2015 Weight: 170.8 lbs. Indications: Caucasian, Family Hx of Osteoporosis, Height Loss, History of Fracture (Adult), Hypothyroid, Postmenopausal Fractures: Treatments: Evista, Multi-Vitamin with calcium, Synthroid, Vitamin D ASSESSMENT: The BMD measured at AP Spine L1-L4 is 0.859 g/cm2 with a T-score of -2.7. This patient is considered OSTEOPOROTIC according to World Health Organization [facility](WHO) criteria. Site Region Measured Measured WHO Young Adult BMD Date       Age      Classification T-score AP Spine L1-L4 03/25/2015 60.5 Osteoporosis -2.7 0.859 g/cm2 AP Spine L1-L4 08/01/2012 57.8 Osteopenia -2.4 0.895 g/cm2 AP Spine L1-L4 07/27/2010 55.8 Osteopenia -2.2 0.917 g/cm2 DualFemur Total Left 03/25/2015 60.5 Osteopenia -2.2 0.732 g/cm2 DualFemur Total Left 08/01/2012 57.8 Osteopenia -2.0 0.751 g/cm2 DualFemur Total Left 07/27/2010 55.8 Osteopenia -1.9 0.772 g/cm2 World Health Organization [facility](WHO) criteria for  post-menopausal, Caucasian Women: Normal:       T-score at or above -1 SD Osteopenia:   T-score between -1 and -2.5 SD Osteoporosis: T-score at or below -2.5 SD RECOMMENDATIONS: National Osteoporosis Foundation recommends that FDA-approved medical therapies be considered in postmenopausal women and men age 60 or older with a: 1. Hip or vertebral (clinical or morphometric) fracture. 2. T-score of < -2.5 at the spine or hip. 3. Ten-year fracture probability by FRAX of 3% or greater for hip fracture or 20% or greater for major osteoporotic fracture. All treatment decisions require clinical judgment and consideration of individual patient factors, including patient preferences, co-morbidities, previous drug use, risk factors not captured in the FRAX model (e.g. falls, vitamin D deficiency, increased bone turnover, interval significant decline in bone density) and possible under - or over-estimation of fracture risk by FRAX. All patients should ensure an adequate intake of dietary calcium (1200 mg/d) and vitamin D (800 IU daily) unless contraindicated. FOLLOW-UP: People with diagnosed cases of osteoporosis or at high risk for fracture should have regular bone mineral density tests. For patients eligible for Medicare, routine testing is allowed once every 2 years. The testing frequency can be increased to one year for patients who have rapidly progressing disease, those who are receiving or discontinuing medical therapy to restore bone mass, or have additional risk factors. I have reviewed this report, and agree with the above findings. Mark A. Tyron RussellBoles, M.D. [facility] Electronically Signed   By: Ulyses SouthwardMark  Boles M.D.   On: 03/25/2015 10:17   Current Outpatient Prescriptions on File Prior to Visit  Medication Sig Dispense Refill  . Ascorbic Acid (VITAMIN C) 1000 MG tablet Take 1,000 mg by mouth daily.    .Marland Kitchen  cholecalciferol (VITAMIN D) 1000 UNITS tablet Take 1,000 Units by mouth daily.    . Coenzyme Q10 (COQ10) 100  MG CAPS Take 100 mg by mouth daily.    Marland Kitchen levothyroxine (SYNTHROID) 112 MCG tablet Take 1 tablet (112 mcg total) by mouth daily before breakfast. 90 tablet 3  . Omega-3 Fatty Acids (SUPER OMEGA 3 EPA/DHA) 1000 MG CAPS Take by mouth.    . raloxifene (EVISTA) 60 MG tablet Take 1 tablet by mouth  daily 90 tablet 1   No current facility-administered medications on file prior to visit.    No Known Allergies  No past medical history on file.  No past surgical history on file.  Family History  Problem Relation Age of Onset  . Diabetes Mother   . Cancer Father     prostate  . Cancer Sister     breast  . Stroke Brother     Social History   Social History  . Marital Status: Married    Spouse Name: N/A  . Number of Children: N/A  . Years of Education: N/A   Occupational History  . Not on file.   Social History Main Topics  . Smoking status: Never Smoker   . Smokeless tobacco: Not on file  . Alcohol Use: No  . Drug Use: No  . Sexual Activity: Yes    Birth Control/ Protection: Post-menopausal   Other Topics Concern  . Not on file   Social History Narrative   The PMH, PSH, Social History, Family History, Medications, and allergies have been reviewed in [facility], and have been updated if relevant.   Review of Systems  Musculoskeletal: Negative.   Neurological: Negative.   Hematological: Negative.   All other systems reviewed and are negative.      Objective:    BP 94/58 mmHg  Pulse 68  Temp(Src) 97.8 F (36.6 C) (Oral)  Wt 172 lb 8 oz (78.245 kg)  SpO2 99%  LMP 05/15/2005   Physical Exam  Constitutional: She is oriented to person, place, and time. She appears well-developed and well-nourished. No distress.  HENT:  Head: Normocephalic.  Eyes: Conjunctivae are normal.  Cardiovascular: Normal rate.   Pulmonary/Chest: Effort normal.  Musculoskeletal: Normal range of motion.  Neurological: She is alert and oriented to person, place, and time. No cranial nerve  deficit.  Skin: Skin is warm and dry.  Psychiatric: She has a normal mood and affect. Her behavior is normal. Judgment and thought content normal.  Nursing note and vitals reviewed.         Assessment & Plan:   Osteoporosis No Follow-up on file.

## 2015-07-09 ENCOUNTER — Other Ambulatory Visit: Payer: Self-pay | Admitting: Family Medicine

## 2015-07-09 DIAGNOSIS — Z1231 Encounter for screening mammogram for malignant neoplasm of breast: Secondary | ICD-10-CM

## 2015-07-30 ENCOUNTER — Ambulatory Visit
Admission: RE | Admit: 2015-07-30 | Discharge: 2015-07-30 | Disposition: A | Discharge status: Home / Self Care | Payer: 59 | Source: Ambulatory Visit | Attending: Family Medicine | Admitting: Family Medicine

## 2015-07-30 ENCOUNTER — Other Ambulatory Visit: Payer: Self-pay | Admitting: Family Medicine

## 2015-07-30 DIAGNOSIS — Z1231 Encounter for screening mammogram for malignant neoplasm of breast: Secondary | ICD-10-CM

## 2015-07-31 LAB — TSH: TSH: 1.98 u[IU]/mL (ref 0.450–4.500)

## 2015-07-31 LAB — T4, FREE: FREE T4: 1.86 ng/dL — AB (ref 0.82–1.77)

## 2015-08-14 ENCOUNTER — Encounter: Payer: Self-pay | Admitting: Family Medicine

## 2016-02-11 ENCOUNTER — Telehealth: Payer: Self-pay | Admitting: Family Medicine

## 2016-02-11 DIAGNOSIS — Z01419 Encounter for gynecological examination (general) (routine) without abnormal findings: Secondary | ICD-10-CM

## 2016-02-11 NOTE — Telephone Encounter (Signed)
Orders entered

## 2016-02-11 NOTE — Telephone Encounter (Signed)
Patient is coming in for her physical on 03/09/16.  Patient works for [employer] and will be having her labs done at [facility].  Patient is asking for lab orders to be put in the computer, so she can have the lab work done before her appointment.

## 2016-03-03 ENCOUNTER — Encounter: Payer: Self-pay | Admitting: Family Medicine

## 2016-03-03 NOTE — Addendum Note (Signed)
Addended by: Alvina ChouWALSH, TERRI J on: 03/03/2016 03:25 PM   Modules accepted: Orders

## 2016-03-06 ENCOUNTER — Other Ambulatory Visit: Payer: 59

## 2016-03-07 LAB — COMPREHENSIVE METABOLIC PANEL
ALBUMIN: 4.3 g/dL (ref 3.6–4.8)
ALK PHOS: 70 IU/L (ref 39–117)
ALT: 11 IU/L (ref 0–32)
AST: 16 IU/L (ref 0–40)
Albumin/Globulin Ratio: 1.5 (ref 1.2–2.2)
BUN / CREAT RATIO: 20 (ref 12–28)
BUN: 17 mg/dL (ref 8–27)
Bilirubin Total: 0.8 mg/dL (ref 0.0–1.2)
CHLORIDE: 101 mmol/L (ref 96–106)
CO2: 29 mmol/L (ref 18–29)
CREATININE: 0.85 mg/dL (ref 0.57–1.00)
Calcium: 9.5 mg/dL (ref 8.7–10.3)
GFR calc Af Amer: 86 mL/min/{1.73_m2} (ref >59)
GFR calc non Af Amer: 74 mL/min/{1.73_m2} (ref >59)
GLUCOSE: 91 mg/dL (ref 65–99)
Globulin, Total: 2.8 g/dL (ref 1.5–4.5)
Potassium: 4.6 mmol/L (ref 3.5–5.2)
Sodium: 141 mmol/L (ref 134–144)
TOTAL PROTEIN: 7.1 g/dL (ref 6.0–8.5)

## 2016-03-07 LAB — TSH: TSH: 0.762 u[IU]/mL (ref 0.450–4.500)

## 2016-03-07 LAB — CBC WITH DIFFERENTIAL/PLATELET
BASOS ABS: 0.1 10*3/uL (ref 0.0–0.2)
Basos: 1 %
EOS (ABSOLUTE): 0.3 10*3/uL (ref 0.0–0.4)
Eos: 5 %
HEMOGLOBIN: 13.7 g/dL (ref 11.1–15.9)
Hematocrit: 41 % (ref 34.0–46.6)
IMMATURE GRANS (ABS): 0 10*3/uL (ref 0.0–0.1)
Immature Granulocytes: 0 %
LYMPHS: 45 %
Lymphocytes Absolute: 2.3 10*3/uL (ref 0.7–3.1)
MCH: 30.9 pg (ref 26.6–33.0)
MCHC: 33.4 g/dL (ref 31.5–35.7)
MCV: 93 fL (ref 79–97)
MONOCYTES: 12 %
Monocytes Absolute: 0.6 10*3/uL (ref 0.1–0.9)
Neutrophils Absolute: 1.9 10*3/uL (ref 1.4–7.0)
Neutrophils: 37 %
PLATELETS: 246 10*3/uL (ref 150–379)
RBC: 4.43 x10E6/uL (ref 3.77–5.28)
RDW: 12.5 % (ref 12.3–15.4)
WBC: 5.1 10*3/uL (ref 3.4–10.8)

## 2016-03-07 LAB — T4, FREE: FREE T4: 1.8 ng/dL — AB (ref 0.82–1.77)

## 2016-03-07 LAB — LIPID PANEL
CHOLESTEROL TOTAL: 201 mg/dL — AB (ref 100–199)
Chol/HDL Ratio: 2.9 ratio units (ref 0.0–4.4)
HDL: 69 mg/dL (ref >39)
LDL CALC: 114 mg/dL — AB (ref 0–99)
Triglycerides: 89 mg/dL (ref 0–149)
VLDL CHOLESTEROL CAL: 18 mg/dL (ref 5–40)

## 2016-03-08 ENCOUNTER — Other Ambulatory Visit (HOSPITAL_COMMUNITY)
Admission: RE | Admit: 2016-03-08 | Discharge: 2016-03-08 | Disposition: A | Discharge status: Home / Self Care | Payer: 59 | Source: Ambulatory Visit | Attending: Family Medicine | Admitting: Family Medicine

## 2016-03-08 ENCOUNTER — Ambulatory Visit (INDEPENDENT_AMBULATORY_CARE_PROVIDER_SITE_OTHER): Payer: 59 | Admitting: Family Medicine

## 2016-03-08 VITALS — BP 110/72 | HR 64 | Temp 98.0°F | Ht 66.25 in | Wt 173.5 lb

## 2016-03-08 DIAGNOSIS — E038 Other specified hypothyroidism: Secondary | ICD-10-CM

## 2016-03-08 DIAGNOSIS — Z1151 Encounter for screening for human papillomavirus (HPV): Secondary | ICD-10-CM | POA: Insufficient documentation

## 2016-03-08 DIAGNOSIS — Z1211 Encounter for screening for malignant neoplasm of colon: Secondary | ICD-10-CM

## 2016-03-08 DIAGNOSIS — M810 Age-related osteoporosis without current pathological fracture: Secondary | ICD-10-CM

## 2016-03-08 DIAGNOSIS — Z01419 Encounter for gynecological examination (general) (routine) without abnormal findings: Secondary | ICD-10-CM

## 2016-03-08 MED ORDER — LEVOTHYROXINE SODIUM 112 MCG PO TABS: 112.0000 ug | ORAL_TABLET | Freq: Every day | ORAL | 3 refills | Status: DC

## 2016-03-08 NOTE — Assessment & Plan Note (Signed)
Stable on current dose of synthroid. No changes made today. 

## 2016-03-08 NOTE — Progress Notes (Signed)
Subjective:    Patient ID: Sherri Lucas, female    DOB: 1955-09-13, 60 y.o.   MRN: [id number]  HPI 60 yo pleasant female  here for CPX and follow up of chronic medical conditions.  Tdap 03/09/14 Mammogram 07/30/15 Pap smear 03/17/13 (normal- [facility]).  LMP 2007- no h/o post menopausal bleeding DEXA 03/2015 Defers flu shot Colonoscopy- she is due, willing to do stool cards.  Hypothyroidism- has been on synthroid 100 mcg daily for years.  TSH is elevated this month. Denies any symptoms of hypo or hyperthyroidism.  Lab Results  Component Value Date   TSH 0.762 03/03/2016    Osteoporosis-  Takes Evista.  Due for DEXA.  Never took fosamax or other bisphosphates- "I heard bad things about it."  Lab Results  Component Value Date   CHOL 201 (H) 03/03/2016   HDL 69 03/03/2016   LDLCALC 114 (H) 03/03/2016   TRIG 89 03/03/2016   CHOLHDL 2.9 03/03/2016   Lab Results  Component Value Date   WBC 5.1 03/03/2016   HGB 13.6 03/04/2014   HCT 41.0 03/03/2016   MCV 93 03/03/2016   PLT 246 03/03/2016   Lab Results  Component Value Date   CREATININE 0.85 03/03/2016    Current Outpatient Prescriptions on File Prior to Visit  Medication Sig Dispense Refill  . Ascorbic Acid (VITAMIN C) 1000 MG tablet Take 1,000 mg by mouth daily.    . cholecalciferol (VITAMIN D) 1000 UNITS tablet Take 1,000 Units by mouth daily.    . Coenzyme Q10 (COQ10) 100 MG CAPS Take 100 mg by mouth daily.    . Omega-3 Fatty Acids (SUPER OMEGA 3 EPA/DHA) 1000 MG CAPS Take by mouth.     No current facility-administered medications on file prior to visit.     No Known Allergies  No past medical history on file.  No past surgical history on file.  Family History  Problem Relation Age of Onset  . Diabetes Mother   . Cancer Father     prostate  . Cancer Sister     breast  . Breast cancer Sister 51  . Stroke Brother     Social History   Social History  . Marital status: Married    Spouse  name: N/A  . Number of children: N/A  . Years of education: N/A   Occupational History  . Not on file.   Social History Main Topics  . Smoking status: Never Smoker  . Smokeless tobacco: Not on file  . Alcohol use No  . Drug use: No  . Sexual activity: Yes    Birth control/ protection: Post-menopausal   Other Topics Concern  . Not on file   Social History Narrative  . No narrative on file   The PMH, PSH, Social History, Family History, Medications, and allergies have been reviewed in [facility], and have been updated if relevant.   Review of Systems  Constitutional: Negative.   HENT: Negative.   Eyes: Negative.   Respiratory: Negative.   Cardiovascular: Negative.   Gastrointestinal: Negative.   Endocrine: Negative.   Genitourinary: Negative.   Musculoskeletal: Negative.   Skin: Negative.   Allergic/Immunologic: Negative.   Neurological: Negative.   Hematological: Negative.   Psychiatric/Behavioral: Negative.   All other systems reviewed and are negative.      Objective:   Physical Exam LMP 05/15/2005     General:  Well-developed,well-nourished,in no acute distress; alert,appropriate and cooperative throughout examination Head:  normocephalic and atraumatic.   Eyes:  vision grossly intact, pupils equal, pupils round, and pupils reactive to light.   Ears:  R ear normal and L ear normal.   Nose:  no external deformity.   Mouth:  good dentition.   Neck:  No deformities, masses, or tenderness noted. Breasts:  No mass, nodules, thickening, tenderness, bulging, retraction, inflamation, nipple discharge or skin changes noted.   Lungs:  Normal respiratory effort, chest expands symmetrically. Lungs are clear to auscultation, no crackles or wheezes. Heart:  Normal rate and regular rhythm. S1 and S2 normal without gallop, murmur, click, rub or other extra sounds. Abdomen:  Bowel sounds positive,abdomen soft and non-tender without masses, organomegaly or hernias noted. Rectal:   no external abnormalities.   Genitalia:  Pelvic Exam:        External: normal female genitalia without lesions or masses        Vagina: normal without lesions or masses        Cervix: normal without lesions or masses        Adnexa: normal bimanual exam without masses or fullness        Uterus: normal by palpation        Pap smear: performed Msk:  No deformity or scoliosis noted of thoracic or lumbar spine.   Extremities:  No clubbing, cyanosis, edema, or deformity noted with normal full range of motion of all joints.   Neurologic:  alert & oriented X3 and gait normal.   Skin:  Intact without suspicious lesions or rashes Cervical Nodes:  No lymphadenopathy noted Axillary Nodes:  No palpable lymphadenopathy Psych:  Cognition and judgment appear intact. Alert and cooperative with normal attention span and concentration. No apparent delusions, illusions, hallucinations      Assessment & Plan:

## 2016-03-08 NOTE — Assessment & Plan Note (Signed)
Has declined further tx options other than evista.

## 2016-03-08 NOTE — Progress Notes (Signed)
Pre visit review using our clinic review tool, if applicable. No additional management support is needed unless otherwise documented below in the visit note. 

## 2016-03-08 NOTE — Assessment & Plan Note (Signed)
Reviewed preventive care protocols, scheduled due services, and updated immunizations Discussed nutrition, exercise, diet, and healthy lifestyle.  

## 2016-03-08 NOTE — Patient Instructions (Signed)
Great to see you. Please stop by the lab to pick up your stool cards.   

## 2016-03-08 NOTE — Addendum Note (Signed)
Addended by: Alvina Chou on: 03/08/2016 11:35 AM   Modules accepted: Orders

## 2016-03-08 NOTE — Addendum Note (Signed)
Addended by: Alvina ChouWALSH, Odeth Bry J on: 03/08/2016 11:38 AM   Modules accepted: Orders

## 2016-03-08 NOTE — Addendum Note (Signed)
Addended by: Desmond DikeKNIGHT, Naziya Hegwood H on: 03/08/2016 11:33 AM   Modules accepted: Orders

## 2016-03-10 LAB — CYTOLOGY - PAP
Diagnosis: NEGATIVE
HPV: NOT DETECTED

## 2016-03-17 LAB — FECAL OCCULT BLOOD, IMMUNOCHEMICAL: Fecal Occult Bld: NEGATIVE

## 2016-03-20 ENCOUNTER — Encounter: Payer: Self-pay | Admitting: *Deleted

## 2016-06-06 DIAGNOSIS — H43811 Vitreous degeneration, right eye: Secondary | ICD-10-CM | POA: Diagnosis not present

## 2016-10-11 ENCOUNTER — Ambulatory Visit (INDEPENDENT_AMBULATORY_CARE_PROVIDER_SITE_OTHER): Payer: 59 | Admitting: Primary Care

## 2016-10-11 ENCOUNTER — Encounter: Payer: Self-pay | Admitting: Primary Care

## 2016-10-11 VITALS — BP 128/86 | HR 73 | Temp 98.3°F | Ht 66.25 in | Wt 175.8 lb

## 2016-10-11 DIAGNOSIS — H65191 Other acute nonsuppurative otitis media, right ear: Secondary | ICD-10-CM

## 2016-10-11 NOTE — Patient Instructions (Signed)
Ear Pressure/Fluid in the Ear: Try using Flonase (fluticasone) nasal spray. Instill 1 spray in each nostril twice daily.   Start Claritin once daily.  Both medications can be purchased over the counter.  It was a pleasure meeting you!

## 2016-10-11 NOTE — Progress Notes (Signed)
Subjective:    Patient ID: Sherri Lucas, female    DOB: 11/11/1955, 61 y.o.   MRN: [id number]  HPI  Sherri Lucas is a 61 year old female who presents today with a chief complaint of ear discomfort. Her discomfort is located to the right ear which has been present for approximately one week. She describes her discomfort as a "tapping" sensation. She's taken OTC sudafed intermittently without much improvement. She denies fevers, chills, cough, sinus pressure.  Review of Systems  Constitutional: Negative for fever.  HENT: Positive for postnasal drip. Negative for congestion, ear pain and sore throat.   Respiratory: Negative for cough.   Allergic/Immunologic: Positive for environmental allergies.       No past medical history on file.   Social History   Social History  . Marital status: Married    Spouse name: N/A  . Number of children: N/A  . Years of education: N/A   Occupational History  . Not on file.   Social History Main Topics  . Smoking status: Never Smoker  . Smokeless tobacco: Never Used  . Alcohol use No  . Drug use: No  . Sexual activity: Yes    Birth control/ protection: Post-menopausal   Other Topics Concern  . Not on file   Social History Narrative  . No narrative on file    No past surgical history on file.  Family History  Problem Relation Age of Onset  . Diabetes Mother   . Cancer Father        prostate  . Cancer Sister        breast  . Breast cancer Sister [id number]  . Stroke Brother     No Known Allergies  Current Outpatient Prescriptions on File Prior to Visit  Medication Sig Dispense Refill  . Ascorbic Acid (VITAMIN C) 1000 MG tablet Take 1,000 mg by mouth daily.    . cholecalciferol (VITAMIN D) 1000 UNITS tablet Take 1,000 Units by mouth daily.    . Coenzyme Q10 (COQ10) 100 MG CAPS Take 100 mg by mouth daily.    Marland Kitchen. levothyroxine (SYNTHROID) 112 MCG tablet Take 1 tablet (112 mcg total) by mouth daily before breakfast. 90 tablet 3  .  Omega-3 Fatty Acids (SUPER OMEGA 3 EPA/DHA) 1000 MG CAPS Take by mouth.     No current facility-administered medications on file prior to visit.     BP 128/86   Pulse 73   Temp 98.3 F (36.8 C) (Oral)   Ht 5' 6.25" (1.683 m)   Wt 175 lb 12.8 oz (79.7 kg)   LMP 05/15/2005   SpO2 98%   BMI 28.16 kg/m    Objective:   Physical Exam  Constitutional: She appears well-nourished.  HENT:  Right Ear: Ear canal normal. Tympanic membrane is not erythematous. A middle ear effusion is present.  Left Ear: Tympanic membrane and ear canal normal. Tympanic membrane is not erythematous.  Nose: Right sinus exhibits no maxillary sinus tenderness and no frontal sinus tenderness. Left sinus exhibits no maxillary sinus tenderness and no frontal sinus tenderness.  Mouth/Throat: Oropharynx is clear and moist.  Eyes: Conjunctivae are normal.  Neck: Neck supple.  Cardiovascular: Normal rate and regular rhythm.   Pulmonary/Chest: Effort normal and breath sounds normal. She has no wheezes. She has no rales.  Lymphadenopathy:    She has no cervical adenopathy.  Skin: Skin is warm and dry.          Assessment & Plan:  Acute Ear Effusion:  Discomfort to right ear x 1 week. Exam today with evidence of effusion. No erythema or other signs to suggest infection. No cerumen impaction. Will start Flonase and Claritin daily. She will update if no improvement.  Sherri Sheldon, NP

## 2017-02-13 ENCOUNTER — Other Ambulatory Visit: Payer: Self-pay | Admitting: Family Medicine

## 2017-03-08 ENCOUNTER — Ambulatory Visit: Payer: 59 | Admitting: Internal Medicine

## 2017-03-09 ENCOUNTER — Ambulatory Visit: Payer: 59 | Admitting: Internal Medicine

## 2017-03-22 ENCOUNTER — Encounter: Payer: Self-pay | Admitting: Internal Medicine

## 2017-03-22 ENCOUNTER — Ambulatory Visit: Payer: 59 | Admitting: Internal Medicine

## 2017-03-22 VITALS — BP 124/80 | HR 70 | Temp 98.2°F | Ht 66.5 in | Wt 173.0 lb

## 2017-03-22 DIAGNOSIS — M810 Age-related osteoporosis without current pathological fracture: Secondary | ICD-10-CM | POA: Diagnosis not present

## 2017-03-22 DIAGNOSIS — Z0000 Encounter for general adult medical examination without abnormal findings: Secondary | ICD-10-CM | POA: Diagnosis not present

## 2017-03-22 DIAGNOSIS — Z1231 Encounter for screening mammogram for malignant neoplasm of breast: Secondary | ICD-10-CM

## 2017-03-22 DIAGNOSIS — E039 Hypothyroidism, unspecified: Secondary | ICD-10-CM

## 2017-03-22 DIAGNOSIS — Z1239 Encounter for other screening for malignant neoplasm of breast: Secondary | ICD-10-CM

## 2017-03-22 NOTE — Assessment & Plan Note (Signed)
Will check CBC, CMET, TSH and Free T4 today Will adjust and refill Synthroid as needed based on labs

## 2017-03-22 NOTE — Assessment & Plan Note (Signed)
Encouraged her to continue Calcium and Vit D  Encouraged her to get 30 minutes of weight bearing activity in daily.

## 2017-03-22 NOTE — Patient Instructions (Signed)
Health Maintenance for Postmenopausal Women Menopause is a normal process in which your reproductive ability comes to an end. This process happens gradually over a span of months to years, usually between the ages of 22 and 9. Menopause is complete when you have missed 12 consecutive menstrual periods. It is important to talk with your health care provider about some of the most common conditions that affect postmenopausal women, such as heart disease, cancer, and bone loss (osteoporosis). Adopting a healthy lifestyle and getting preventive care can help to promote your health and wellness. Those actions can also lower your chances of developing some of these common conditions. What should I know about menopause? During menopause, you may experience a number of symptoms, such as:  Moderate-to-severe hot flashes.  Night sweats.  Decrease in sex drive.  Mood swings.  Headaches.  Tiredness.  Irritability.  Memory problems.  Insomnia.  Choosing to treat or not to treat menopausal changes is an individual decision that you make with your health care provider. What should I know about hormone replacement therapy and supplements? Hormone therapy products are effective for treating symptoms that are associated with menopause, such as hot flashes and night sweats. Hormone replacement carries certain risks, especially as you become older. If you are thinking about using estrogen or estrogen with progestin treatments, discuss the benefits and risks with your health care provider. What should I know about heart disease and stroke? Heart disease, heart attack, and stroke become more likely as you age. This may be due, in part, to the hormonal changes that your body experiences during menopause. These can affect how your body processes dietary fats, triglycerides, and cholesterol. Heart attack and stroke are both medical emergencies. There are many things that you can do to help prevent heart disease  and stroke:  Have your blood pressure checked at least every 1-2 years. High blood pressure causes heart disease and increases the risk of stroke.  If you are 53-22 years old, ask your health care provider if you should take aspirin to prevent a heart attack or a stroke.  Do not use any tobacco products, including cigarettes, chewing tobacco, or electronic cigarettes. If you need help quitting, ask your health care provider.  It is important to eat a healthy diet and maintain a healthy weight. ? Be sure to include plenty of vegetables, fruits, low-fat dairy products, and lean protein. ? Avoid eating foods that are high in solid fats, added sugars, or salt (sodium).  Get regular exercise. This is one of the most important things that you can do for your health. ? Try to exercise for at least 150 minutes each week. The type of exercise that you do should increase your heart rate and make you sweat. This is known as moderate-intensity exercise. ? Try to do strengthening exercises at least twice each week. Do these in addition to the moderate-intensity exercise.  Know your numbers.Ask your health care provider to check your cholesterol and your blood glucose. Continue to have your blood tested as directed by your health care provider.  What should I know about cancer screening? There are several types of cancer. Take the following steps to reduce your risk and to catch any cancer development as early as possible. Breast Cancer  Practice breast self-awareness. ? This means understanding how your breasts normally appear and feel. ? It also means doing regular breast self-exams. Let your health care provider know about any changes, no matter how small.  If you are 40  or older, have a clinician do a breast exam (clinical breast exam or CBE) every year. Depending on your age, family history, and medical history, it may be recommended that you also have a yearly breast X-ray (mammogram).  If you  have a family history of breast cancer, talk with your health care provider about genetic screening.  If you are at high risk for breast cancer, talk with your health care provider about having an MRI and a mammogram every year.  Breast cancer (BRCA) gene test is recommended for women who have family members with BRCA-related cancers. Results of the assessment will determine the need for genetic counseling and BRCA1 and for BRCA2 testing. BRCA-related cancers include these types: ? Breast. This occurs in males or females. ? Ovarian. ? Tubal. This may also be called fallopian tube cancer. ? Cancer of the abdominal or pelvic lining (peritoneal cancer). ? Prostate. ? Pancreatic.  Cervical, Uterine, and Ovarian Cancer Your health care provider may recommend that you be screened regularly for cancer of the pelvic organs. These include your ovaries, uterus, and vagina. This screening involves a pelvic exam, which includes checking for microscopic changes to the surface of your cervix (Pap test).  For women ages 21-65, health care providers may recommend a pelvic exam and a Pap test every three years. For women ages 79-65, they may recommend the Pap test and pelvic exam, combined with testing for human papilloma virus (HPV), every five years. Some types of HPV increase your risk of cervical cancer. Testing for HPV may also be done on women of any age who have unclear Pap test results.  Other health care providers may not recommend any screening for nonpregnant women who are considered low risk for pelvic cancer and have no symptoms. Ask your health care provider if a screening pelvic exam is right for you.  If you have had past treatment for cervical cancer or a condition that could lead to cancer, you need Pap tests and screening for cancer for at least 20 years after your treatment. If Pap tests have been discontinued for you, your risk factors (such as having a new sexual partner) need to be  reassessed to determine if you should start having screenings again. Some women have medical problems that increase the chance of getting cervical cancer. In these cases, your health care provider may recommend that you have screening and Pap tests more often.  If you have a family history of uterine cancer or ovarian cancer, talk with your health care provider about genetic screening.  If you have vaginal bleeding after reaching menopause, tell your health care provider.  There are currently no reliable tests available to screen for ovarian cancer.  Lung Cancer Lung cancer screening is recommended for adults 69-62 years old who are at high risk for lung cancer because of a history of smoking. A yearly low-dose CT scan of the lungs is recommended if you:  Currently smoke.  Have a history of at least 30 pack-years of smoking and you currently smoke or have quit within the past 15 years. A pack-year is smoking an average of one pack of cigarettes per day for one year.  Yearly screening should:  Continue until it has been 15 years since you quit.  Stop if you develop a health problem that would prevent you from having lung cancer treatment.  Colorectal Cancer  This type of cancer can be detected and can often be prevented.  Routine colorectal cancer screening usually begins at  age 42 and continues through age 45.  If you have risk factors for colon cancer, your health care provider may recommend that you be screened at an earlier age.  If you have a family history of colorectal cancer, talk with your health care provider about genetic screening.  Your health care provider may also recommend using home test kits to check for hidden blood in your stool.  A small camera at the end of a tube can be used to examine your colon directly (sigmoidoscopy or colonoscopy). This is done to check for the earliest forms of colorectal cancer.  Direct examination of the colon should be repeated every  5-10 years until age 71. However, if early forms of precancerous polyps or small growths are found or if you have a family history or genetic risk for colorectal cancer, you may need to be screened more often.  Skin Cancer  Check your skin from head to toe regularly.  Monitor any moles. Be sure to tell your health care provider: ? About any new moles or changes in moles, especially if there is a change in a mole's shape or color. ? If you have a mole that is larger than the size of a pencil eraser.  If any of your family members has a history of skin cancer, especially at a young age, talk with your health care provider about genetic screening.  Always use sunscreen. Apply sunscreen liberally and repeatedly throughout the day.  Whenever you are outside, protect yourself by wearing long sleeves, pants, a wide-brimmed hat, and sunglasses.  What should I know about osteoporosis? Osteoporosis is a condition in which bone destruction happens more quickly than new bone creation. After menopause, you may be at an increased risk for osteoporosis. To help prevent osteoporosis or the bone fractures that can happen because of osteoporosis, the following is recommended:  If you are 46-71 years old, get at least 1,000 mg of calcium and at least 600 mg of vitamin D per day.  If you are older than age 55 but younger than age 65, get at least 1,200 mg of calcium and at least 600 mg of vitamin D per day.  If you are older than age 54, get at least 1,200 mg of calcium and at least 800 mg of vitamin D per day.  Smoking and excessive alcohol intake increase the risk of osteoporosis. Eat foods that are rich in calcium and vitamin D, and do weight-bearing exercises several times each week as directed by your health care provider. What should I know about how menopause affects my mental health? Depression may occur at any age, but it is more common as you become older. Common symptoms of depression  include:  Low or sad mood.  Changes in sleep patterns.  Changes in appetite or eating patterns.  Feeling an overall lack of motivation or enjoyment of activities that you previously enjoyed.  Frequent crying spells.  Talk with your health care provider if you think that you are experiencing depression. What should I know about immunizations? It is important that you get and maintain your immunizations. These include:  Tetanus, diphtheria, and pertussis (Tdap) booster vaccine.  Influenza every year before the flu season begins.  Pneumonia vaccine.  Shingles vaccine.  Your health care provider may also recommend other immunizations. This information is not intended to replace advice given to you by your health care provider. Make sure you discuss any questions you have with your health care provider. Document Released: 06/23/2005  Document Revised: 11/19/2015 Document Reviewed: 02/02/2015 Elsevier Interactive Patient Education  2018 [employer] Inc.  

## 2017-03-22 NOTE — Progress Notes (Signed)
HPI  Pt presents to the clinic today to establish care and for management of the conditions listed below. She is transferring care from Dr. Dayton MartesAron. She would like her annual exam today.  Osteoporosis: She refused to take prescription treatment for this. She is taking Calcium and Vit D OTC. She tries to get 30 minutes of weight bearing exercise in daily but is not always successful.   Hypothyroidism: She denies any issues on her current dose of Synthroid. She is due to recheck her levels today.   Flu: never Tetanus: 04/2009 Pap Smear: 02/2016 Mammogram: 07/2015 Bone Density: 03/2015 Colon Screening: 09/2016 Vision Screening: annually, 06/2016 Dentist: as needed  Diet: She does eat meat. She consumes fruits and veggies daily. She rarely eats fried foods. She drinks mostly water. Exercise: None  No past medical history on file.  Current Outpatient Medications  Medication Sig Dispense Refill  . Ascorbic Acid (VITAMIN C) 1000 MG tablet Take 1,000 mg by mouth daily.    . cholecalciferol (VITAMIN D) 1000 UNITS tablet Take 1,000 Units by mouth daily.    . Coenzyme Q10 (COQ10) 100 MG CAPS Take 100 mg by mouth daily.    Marland Kitchen. levothyroxine (SYNTHROID, LEVOTHROID) 112 MCG tablet TAKE 1 TABLET BY MOUTH  DAILY BEFORE BREAKFAST 90 tablet 0  . Omega-3 Fatty Acids (SUPER OMEGA 3 EPA/DHA) 1000 MG CAPS Take by mouth.     No current facility-administered medications for this visit.     No Known Allergies  Family History  Problem Relation Age of Onset  . Diabetes Mother   . Cancer Father        prostate  . Cancer Sister        breast  . Breast cancer Sister [id number]  . Stroke Brother     Social History   Socioeconomic History  . Marital status: Married    Spouse name: Not on file  . Number of children: Not on file  . Years of education: Not on file  . Highest education level: Not on file  Social Needs  . Financial resource strain: Not on file  . Food insecurity - worry: Not on file  . Food  insecurity - inability: Not on file  . Transportation needs - medical: Not on file  . Transportation needs - non-medical: Not on file  Occupational History  . Not on file  Tobacco Use  . Smoking status: Never Smoker  . Smokeless tobacco: Never Used  Substance and Sexual Activity  . Alcohol use: No  . Drug use: No  . Sexual activity: Yes    Birth control/protection: Post-menopausal  Other Topics Concern  . Not on file  Social History Narrative  . Not on file    ROS:  Constitutional: Denies fever, malaise, fatigue, headache or abrupt weight changes.  HEENT: Denies eye pain, eye redness, ear pain, ringing in the ears, wax buildup, runny nose, nasal congestion, bloody nose, or sore throat. Respiratory: Denies difficulty breathing, shortness of breath, cough or sputum production.   Cardiovascular: Denies chest pain, chest tightness, palpitations or swelling in the hands or feet.  Gastrointestinal: Denies abdominal pain, bloating, constipation, diarrhea or blood in the stool.  GU: Denies frequency, urgency, pain with urination, blood in urine, odor or discharge. Musculoskeletal: Denies decrease in range of motion, difficulty with gait, muscle pain or joint pain and swelling.  Skin: Denies redness, rashes, lesions or ulcercations.  Neurological: Denies dizziness, difficulty with memory, difficulty with speech or problems with balance and coordination.  Psych:  Denies anxiety, depression, SI/HI.  No other specific complaints in a complete review of systems (except as listed in HPI above).  PE:  BP 124/80   Pulse 70   Temp 98.2 F (36.8 C) (Oral)   Ht 5' 6.5" (1.689 m)   Wt 173 lb (78.5 kg)   LMP 05/15/2005   SpO2 98%   BMI 27.50 kg/m  Wt Readings from Last 3 Encounters:  03/22/17 173 lb (78.5 kg)  10/11/16 175 lb 12.8 oz (79.7 kg)  03/08/16 173 lb 8 oz (78.7 kg)    General: Appears her stated age, well developed, well nourished in NAD. HEENT: Head: normal shape and size;  Eyes: sclera white, no icterus, conjunctiva pink, PERRLA and EOMs intact; Ears: Tm's gray and intact, normal light reflex;Throat/Mouth: Teeth present, mucosa pink and moist, no lesions or ulcerations noted.  Neck: Neck supple, trachea midline. No masses, lumps or thyromegaly present.  Cardiovascular: Normal rate and rhythm. S1,S2 noted.  No murmur, rubs or gallops noted. No JVD or BLE edema. No carotid bruits noted. Pulmonary/Chest: Normal effort and positive vesicular breath sounds. No respiratory distress. No wheezes, rales or ronchi noted.  Abdomen: Soft and nontender. Normal bowel sounds, no bruits noted. No distention or masses noted. Liver, spleen and kidneys non palpable. Musculoskeletal: Strength 5/5 BUE/BLE. No signs of joint swelling. No difficulty with gait.  Neurological: Alert and oriented. Cranial nerves II-XII grossly intact. Coordination normal.  Psychiatric: Mood and affect normal. Behavior is normal. Judgment and thought content normal.    BMET    Component Value Date/Time   NA 141 03/03/2016 1525   K 4.6 03/03/2016 1525   CL 101 03/03/2016 1525   CO2 29 03/03/2016 1525   GLUCOSE 91 03/03/2016 1525   BUN 17 03/03/2016 1525   CREATININE 0.85 03/03/2016 1525   CALCIUM 9.5 03/03/2016 1525   GFRNONAA 74 03/03/2016 1525   GFRAA 86 03/03/2016 1525    Lipid Panel     Component Value Date/Time   CHOL 201 (H) 03/03/2016 1525   TRIG 89 03/03/2016 1525   HDL 69 03/03/2016 1525   CHOLHDL 2.9 03/03/2016 1525   LDLCALC 114 (H) 03/03/2016 1525    CBC    Component Value Date/Time   WBC 5.1 03/03/2016 1525   RBC 4.43 03/03/2016 1525   HGB 13.7 03/03/2016 1525   HCT 41.0 03/03/2016 1525   PLT 246 03/03/2016 1525   MCV 93 03/03/2016 1525   MCH 30.9 03/03/2016 1525   MCHC 33.4 03/03/2016 1525   RDW 12.5 03/03/2016 1525   LYMPHSABS 2.3 03/03/2016 1525   EOSABS 0.3 03/03/2016 1525   BASOSABS 0.1 03/03/2016 1525    Hgb A1C No results found for: HGBA1C   Assessment  and Plan:  Preventative Health Maintenance:  She declines flu shot Tetanus UTD Pap smear UTD Mammogram ordered, she will call to schedule her appt Colon screening UTD Encouraged her to consume a balanced diet and exercise regimen Advised her to see an eye doctor and dentist annually Will check CBC and CMET today  RTC in 1 year, sooner if needed Nicki ReaperBAITY, REGINA, NP

## 2017-04-03 DIAGNOSIS — Z1231 Encounter for screening mammogram for malignant neoplasm of breast: Secondary | ICD-10-CM | POA: Diagnosis not present

## 2017-04-03 DIAGNOSIS — E039 Hypothyroidism, unspecified: Secondary | ICD-10-CM | POA: Diagnosis not present

## 2017-04-04 LAB — CBC
Hematocrit: 39.9 % (ref 34.0–46.6)
Hemoglobin: 13.3 g/dL (ref 11.1–15.9)
MCH: 30.6 pg (ref 26.6–33.0)
MCHC: 33.3 g/dL (ref 31.5–35.7)
MCV: 92 fL (ref 79–97)
PLATELETS: 265 10*3/uL (ref 150–379)
RBC: 4.34 x10E6/uL (ref 3.77–5.28)
RDW: 13 % (ref 12.3–15.4)
WBC: 5.2 10*3/uL (ref 3.4–10.8)

## 2017-04-04 LAB — COMPREHENSIVE METABOLIC PANEL
ALT: 13 IU/L (ref 0–32)
AST: 21 IU/L (ref 0–40)
Albumin/Globulin Ratio: 1.8 (ref 1.2–2.2)
Albumin: 4.4 g/dL (ref 3.6–4.8)
Alkaline Phosphatase: 88 IU/L (ref 39–117)
BILIRUBIN TOTAL: 0.5 mg/dL (ref 0.0–1.2)
BUN/Creatinine Ratio: 13 (ref 12–28)
BUN: 15 mg/dL (ref 8–27)
CALCIUM: 9.6 mg/dL (ref 8.7–10.3)
CHLORIDE: 103 mmol/L (ref 96–106)
CO2: 26 mmol/L (ref 20–29)
Creatinine, Ser: 1.17 mg/dL — ABNORMAL HIGH (ref 0.57–1.00)
GFR, EST AFRICAN AMERICAN: 58 mL/min/{1.73_m2} — AB (ref >59)
GFR, EST NON AFRICAN AMERICAN: 50 mL/min/{1.73_m2} — AB (ref >59)
GLUCOSE: 103 mg/dL — AB (ref 65–99)
Globulin, Total: 2.4 g/dL (ref 1.5–4.5)
Potassium: 4.3 mmol/L (ref 3.5–5.2)
Sodium: 142 mmol/L (ref 134–144)
TOTAL PROTEIN: 6.8 g/dL (ref 6.0–8.5)

## 2017-04-04 LAB — T4, FREE: FREE T4: 1.71 ng/dL (ref 0.82–1.77)

## 2017-04-04 LAB — TSH: TSH: 1.11 u[IU]/mL (ref 0.450–4.500)

## 2017-04-13 MED ORDER — LEVOTHYROXINE SODIUM 112 MCG PO TABS: 112.0000 ug | ORAL_TABLET | Freq: Every day | ORAL | 3 refills | Status: DC

## 2017-04-13 NOTE — Addendum Note (Signed)
Addended by: Roena MaladyEVONTENNO, Suzzette Gasparro Y on: 04/13/2017 04:57 PM   Modules accepted: Orders

## 2017-05-11 ENCOUNTER — Ambulatory Visit
Admission: RE | Admit: 2017-05-11 | Discharge: 2017-05-11 | Disposition: A | Discharge status: Home / Self Care | Payer: 59 | Source: Ambulatory Visit | Attending: Internal Medicine | Admitting: Internal Medicine

## 2017-05-11 DIAGNOSIS — Z1231 Encounter for screening mammogram for malignant neoplasm of breast: Secondary | ICD-10-CM | POA: Insufficient documentation

## 2017-05-11 DIAGNOSIS — E039 Hypothyroidism, unspecified: Secondary | ICD-10-CM

## 2018-03-16 ENCOUNTER — Other Ambulatory Visit: Payer: Self-pay | Admitting: Internal Medicine

## 2018-03-28 ENCOUNTER — Encounter: Payer: Self-pay | Admitting: Internal Medicine

## 2018-03-28 ENCOUNTER — Ambulatory Visit (INDEPENDENT_AMBULATORY_CARE_PROVIDER_SITE_OTHER): Payer: 59 | Admitting: Internal Medicine

## 2018-03-28 ENCOUNTER — Other Ambulatory Visit: Payer: Self-pay | Admitting: Internal Medicine

## 2018-03-28 VITALS — BP 132/76 | HR 100 | Temp 98.3°F | Ht 66.5 in | Wt 168.6 lb

## 2018-03-28 DIAGNOSIS — Z1231 Encounter for screening mammogram for malignant neoplasm of breast: Secondary | ICD-10-CM | POA: Diagnosis not present

## 2018-03-28 DIAGNOSIS — M810 Age-related osteoporosis without current pathological fracture: Secondary | ICD-10-CM

## 2018-03-28 DIAGNOSIS — E039 Hypothyroidism, unspecified: Secondary | ICD-10-CM | POA: Diagnosis not present

## 2018-03-28 DIAGNOSIS — Z13220 Encounter for screening for lipoid disorders: Secondary | ICD-10-CM | POA: Diagnosis not present

## 2018-03-28 DIAGNOSIS — E038 Other specified hypothyroidism: Secondary | ICD-10-CM | POA: Diagnosis not present

## 2018-03-28 DIAGNOSIS — Z0000 Encounter for general adult medical examination without abnormal findings: Secondary | ICD-10-CM | POA: Diagnosis not present

## 2018-03-28 NOTE — Patient Instructions (Addendum)
Strontium 500 mg daily  Calcium 600 2x per day  Vitamin D 1000-2000 D3 IU Tylenol 500 up to 6 x per day or sparingly Aleve, Naproxen   Knee Pain, Adult Knee pain in adults is common. It can be caused by many things, including:  Arthritis.  A fluid-filled sac (cyst) or growth in your knee.  An infection in your knee.  An injury that will not heal.  Damage, swelling, or irritation of the tissues that support your knee.  Knee pain is usually not a sign of a serious problem. The pain may go away on its own with time and rest. If it does not, a health care provider may order tests to find the cause of the pain. These may include:  Imaging tests, such as an X-ray, MRI, or ultrasound.  Joint aspiration. In this test, fluid is removed from the knee.  Arthroscopy. In this test, a lighted tube is inserted into knee and an image is projected onto a TV screen.  A biopsy. In this test, a sample of tissue is removed from the body and studied under a microscope.  Follow these instructions at home: Pay attention to any changes in your symptoms. Take these actions to relieve your pain. Activity  Rest your knee.  Do not do things that cause pain or make pain worse.  Avoid high-impact activities or exercises, such as running, jumping rope, or doing jumping jacks. General instructions  Take over-the-counter and prescription medicines only as told by your health care provider.  Raise (elevate) your knee above the level of your heart when you are sitting or lying down.  Sleep with a pillow under your knee.  If directed, apply ice to the knee: ? Put ice in a plastic bag. ? Place a towel between your skin and the bag. ? Leave the ice on for 20 minutes, 2-3 times a day.  Ask your health care provider if you should wear an elastic knee support.  Lose weight if you are overweight. Extra weight can put pressure on your knee.  Do not use any products that contain nicotine or tobacco, such as  cigarettes and e-cigarettes. Smoking may slow the healing of any bone and joint problems that you may have. If you need help quitting, ask your health care provider. Contact a health care provider if:  Your knee pain continues, changes, or gets worse.  You have a fever along with knee pain.  Your knee buckles or locks up.  Your knee swells, and the swelling becomes worse. Get help right away if:  Your knee feels warm to the touch.  You cannot move your knee.  You have severe pain in your knee.  You have chest pain.  You have trouble breathing. Summary  Knee pain in adults is common. It can be caused by many things, including, arthritis, infection, cysts, or injury.  Knee pain is usually not a sign of a serious problem, but if it does not go away, a health care provider may perform tests to know the cause of the pain.  Pay attention to any changes in your symptoms. Relieve your pain with rest, medicines, light activity, and use of ice.  Get help if your pain continues or becomes very severe, or if your knee buckles or locks up, or if you have chest pain or trouble breathing. This information is not intended to replace advice given to you by your health care provider. Make sure you discuss any questions you have with  your health care provider. Document Released: 02/26/2007 Document Revised: 04/21/2016 Document Reviewed: 04/21/2016 Elsevier Interactive Patient Education  2018 [employer]  Osteoporosis Osteoporosis is the thinning and loss of density in the bones. Osteoporosis makes the bones more brittle, fragile, and likely to break (fracture). Over time, osteoporosis can cause the bones to become so weak that they fracture after a simple fall. The bones most likely to fracture are the bones in the hip, wrist, and spine. What are the causes? The exact cause is not known. What increases the risk? Anyone can develop osteoporosis. You may be at greater risk if you have a family  history of the condition or have poor nutrition. You may also have a higher risk if you are:  Female.  63 years old or older.  A smoker.  Not physically active.  White or Asian.  Slender.  What are the signs or symptoms? A fracture might be the first sign of the disease, especially if it results from a fall or injury that would not usually cause a bone to break. Other signs and symptoms include:  Low back and neck pain.  Stooped posture.  Height loss.  How is this diagnosed? To make a diagnosis, your health care provider may:  Take a medical history.  Perform a physical exam.  Order tests, such as: ? A bone mineral density test. ? A dual-energy X-ray absorptiometry test.  How is this treated? The goal of osteoporosis treatment is to strengthen your bones to reduce your risk of a fracture. Treatment may involve:  Making lifestyle changes, such as: ? Eating a diet rich in calcium. ? Doing weight-bearing and muscle-strengthening exercises. ? Stopping tobacco use. ? Limiting alcohol intake.  Taking medicine to slow the process of bone loss or to increase bone density.  Monitoring your levels of calcium and vitamin D.  Follow these instructions at home:  Include calcium and vitamin D in your diet. Calcium is important for bone health, and vitamin D helps the body absorb calcium.  Perform weight-bearing and muscle-strengthening exercises as directed by your health care provider.  Do not use any tobacco products, including cigarettes, chewing tobacco, and electronic cigarettes. If you need help quitting, ask your health care provider.  Limit your alcohol intake.  Take medicines only as directed by your health care provider.  Keep all follow-up visits as directed by your health care provider. This is important.  Take precautions at home to lower your risk of falling, such as: ? Keeping rooms well lit and clutter free. ? Installing safety rails on  stairs. ? Using rubber mats in the bathroom and other areas that are often wet or slippery. Get help right away if: You fall or injure yourself. This information is not intended to replace advice given to you by your health care provider. Make sure you discuss any questions you have with your health care provider. Document Released: 02/08/2005 Document Revised: 10/04/2015 Document Reviewed: 10/09/2013 Elsevier Interactive Patient Education  [employer]2018 [employer]

## 2018-03-28 NOTE — Progress Notes (Signed)
Chief Complaint  Patient presents with  . Transitions Of Care   F/u  1. Hypothyroidism on levo 112  2. C/o mild left knee pain x weeks tries otc nsaids and helps declines Xray for now knee pain in inner knee   Review of Systems  Constitutional: Negative for weight loss.  HENT: Negative for hearing loss.   Eyes: Negative for blurred vision.  Respiratory: Negative for shortness of breath.   Cardiovascular: Negative for chest pain.  Gastrointestinal: Negative for abdominal pain and blood in stool.  Musculoskeletal: Positive for joint pain.  Skin: Negative for rash.  Neurological: Negative for headaches.  Psychiatric/Behavioral: Negative for depression.   No past medical history on file. No past surgical history on file. Family History  Problem Relation Age of Onset  . Diabetes Mother   . Cancer Father        prostate  . Cancer Sister        breast  . Breast cancer Sister 47  . Stroke Brother    Social History   Socioeconomic History  . Marital status: Married    Spouse name: Not on file  . Number of children: Not on file  . Years of education: Not on file  . Highest education level: Not on file  Occupational History  . Not on file  Social Needs  . Financial resource strain: Not on file  . Food insecurity:    Worry: Not on file    Inability: Not on file  . Transportation needs:    Medical: Not on file    Non-medical: Not on file  Tobacco Use  . Smoking status: Never Smoker  . Smokeless tobacco: Never Used  Substance and Sexual Activity  . Alcohol use: No  . Drug use: No  . Sexual activity: Yes    Birth control/protection: Post-menopausal  Lifestyle  . Physical activity:    Days per week: Not on file    Minutes per session: Not on file  . Stress: Not on file  Relationships  . Social connections:    Talks on phone: Not on file    Gets together: Not on file    Attends religious service: Not on file    Active member of club or organization: Not on file   Attends meetings of clubs or organizations: Not on file    Relationship status: Not on file  . Intimate partner violence:    Fear of current or ex partner: Not on file    Emotionally abused: Not on file    Physically abused: Not on file    Forced sexual activity: Not on file  Other Topics Concern  . Not on file  Social History Narrative  . Not on file   Current Meds  Medication Sig  . Ascorbic Acid (VITAMIN C) 1000 MG tablet Take 1,000 mg by mouth daily.  . cholecalciferol (VITAMIN D) 1000 UNITS tablet Take 1,000 Units by mouth daily.  . Coenzyme Q10 (COQ10) 100 MG CAPS Take 100 mg by mouth daily.  Marland Kitchen levothyroxine (SYNTHROID, LEVOTHROID) 112 MCG tablet Take 1 tablet (112 mcg total) by mouth daily before breakfast. MUST SCHEDULE ANNUAL EXAM  . Omega-3 Fatty Acids (SUPER OMEGA 3 EPA/DHA) 1000 MG CAPS Take by mouth.   No Known Allergies No results found for this or any previous visit (from the past 2160 hour(s)). Objective  Body mass index is 26.81 kg/m. Wt Readings from Last 3 Encounters:  03/28/18 168 lb 9.6 oz (76.5 kg)  03/22/17 173  lb (78.5 kg)  10/11/16 175 lb 12.8 oz (79.7 kg)   Temp Readings from Last 3 Encounters:  03/28/18 98.3 F (36.8 C) (Oral)  03/22/17 98.2 F (36.8 C) (Oral)  10/11/16 98.3 F (36.8 C) (Oral)   BP Readings from Last 3 Encounters:  03/28/18 132/76  03/22/17 124/80  10/11/16 128/86   Pulse Readings from Last 3 Encounters:  03/28/18 100  03/22/17 70  10/11/16 73    Physical Exam  Constitutional: She is oriented to person, place, and time. She appears well-developed and well-nourished.  HENT:  Head: Normocephalic and atraumatic.  Mouth/Throat: Oropharynx is clear and moist.  Eyes: Pupils are equal, round, and reactive to light. Conjunctivae are normal.  Cardiovascular: Normal rate, regular rhythm and normal heart sounds.  Pulmonary/Chest: Effort normal and breath sounds normal.  Neurological: She is alert and oriented to person, place,  and time.  Skin: Skin is warm and dry.  Psychiatric: She has a normal mood and affect. Her behavior is normal. Judgment and thought content normal.  Nursing note and vitals reviewed.   Assessment   1. Hypothyroidism  2. Left knee pain  3. HM Plan   1. Check fasting labs cmet, cbc, lipid, tsh, ua, vitamin d Levo 112 refill based on labs  2. Declines Xray for now disc otc meds tylenol nsaids sparingly  3.  Will get flu shot at work  tdap utd  Consider shingrix  mammo referred today  Colonoscopy wants to wait another year had 10/05/06 IH dexa 02/23/15 osteopenia/porosis declines prolia was on evista x 2-3 w/o help disc strontium 500 m gqd  Pap had 03/05/16 neg neg HPV no h/o abnormla    Provider: Dr. French Anaracy McLean-Scocuzza-[facility] Medicine

## 2018-03-28 NOTE — Progress Notes (Signed)
Pre visit review using our clinic review tool, if applicable. No additional management support is needed unless otherwise documented below in the visit note. 

## 2018-03-29 LAB — CBC WITH DIFFERENTIAL/PLATELET
BASOS ABS: 0.1 10*3/uL (ref 0.0–0.2)
Basos: 2 %
EOS (ABSOLUTE): 0.3 10*3/uL (ref 0.0–0.4)
Eos: 5 %
HEMOGLOBIN: 13.1 g/dL (ref 11.1–15.9)
Hematocrit: 41.3 % (ref 34.0–46.6)
Immature Grans (Abs): 0 10*3/uL (ref 0.0–0.1)
Immature Granulocytes: 0 %
Lymphocytes Absolute: 1.7 10*3/uL (ref 0.7–3.1)
Lymphs: 37 %
MCH: 28.8 pg (ref 26.6–33.0)
MCHC: 31.7 g/dL (ref 31.5–35.7)
MCV: 91 fL (ref 79–97)
MONOCYTES: 12 %
Monocytes Absolute: 0.5 10*3/uL (ref 0.1–0.9)
NEUTROS ABS: 2.1 10*3/uL (ref 1.4–7.0)
Neutrophils: 44 %
Platelets: 282 10*3/uL (ref 150–450)
RBC: 4.55 x10E6/uL (ref 3.77–5.28)
RDW: 11.5 % — ABNORMAL LOW (ref 12.3–15.4)
WBC: 4.6 10*3/uL (ref 3.4–10.8)

## 2018-03-29 LAB — URINALYSIS, ROUTINE W REFLEX MICROSCOPIC
Bilirubin, UA: NEGATIVE
GLUCOSE, UA: NEGATIVE
KETONES UA: NEGATIVE
NITRITE UA: NEGATIVE
Protein, UA: NEGATIVE
RBC, UA: NEGATIVE
SPEC GRAV UA: 1.018 (ref 1.005–1.030)
Urobilinogen, Ur: 1 mg/dL (ref 0.2–1.0)
pH, UA: 6.5 (ref 5.0–7.5)

## 2018-03-29 LAB — LIPID PANEL W/O CHOL/HDL RATIO
CHOLESTEROL TOTAL: 188 mg/dL (ref 100–199)
HDL: 79 mg/dL (ref >39)
LDL Calculated: 96 mg/dL (ref 0–99)
Triglycerides: 67 mg/dL (ref 0–149)
VLDL CHOLESTEROL CAL: 13 mg/dL (ref 5–40)

## 2018-03-29 LAB — COMPREHENSIVE METABOLIC PANEL
ALBUMIN: 4.2 g/dL (ref 3.6–4.8)
ALT: 13 IU/L (ref 0–32)
AST: 23 IU/L (ref 0–40)
Albumin/Globulin Ratio: 1.6 (ref 1.2–2.2)
Alkaline Phosphatase: 81 IU/L (ref 39–117)
BILIRUBIN TOTAL: 1.1 mg/dL (ref 0.0–1.2)
BUN / CREAT RATIO: 17 (ref 12–28)
BUN: 15 mg/dL (ref 8–27)
CHLORIDE: 103 mmol/L (ref 96–106)
CO2: 26 mmol/L (ref 20–29)
Calcium: 9.6 mg/dL (ref 8.7–10.3)
Creatinine, Ser: 0.9 mg/dL (ref 0.57–1.00)
GFR calc Af Amer: 79 mL/min/{1.73_m2} (ref >59)
GFR calc non Af Amer: 68 mL/min/{1.73_m2} (ref >59)
GLUCOSE: 88 mg/dL (ref 65–99)
Globulin, Total: 2.7 g/dL (ref 1.5–4.5)
Potassium: 4.2 mmol/L (ref 3.5–5.2)
SODIUM: 142 mmol/L (ref 134–144)
Total Protein: 6.9 g/dL (ref 6.0–8.5)

## 2018-03-29 LAB — TSH: TSH: 3.44 u[IU]/mL (ref 0.450–4.500)

## 2018-03-29 LAB — MICROSCOPIC EXAMINATION
Casts: NONE SEEN /lpf
Epithelial Cells (non renal): >10 /hpf — AB (ref 0–10)

## 2018-03-29 LAB — VITAMIN D 25 HYDROXY (VIT D DEFICIENCY, FRACTURES): Vit D, 25-Hydroxy: 35.3 ng/mL (ref 30.0–100.0)

## 2018-04-02 ENCOUNTER — Encounter: Payer: Self-pay | Admitting: Internal Medicine

## 2018-04-02 ENCOUNTER — Telehealth: Payer: Self-pay | Admitting: Internal Medicine

## 2018-04-02 NOTE — Telephone Encounter (Signed)
Just a FYI

## 2018-04-02 NOTE — Telephone Encounter (Signed)
Pt returned call and lab message given to her with verbal understanding. She stated that she is not having any UTI symptoms. But will could back for any concerns.

## 2018-04-29 ENCOUNTER — Other Ambulatory Visit: Payer: Self-pay | Admitting: Internal Medicine

## 2018-04-29 ENCOUNTER — Ambulatory Visit
Admission: RE | Admit: 2018-04-29 | Discharge: 2018-04-29 | Disposition: A | Discharge status: Home / Self Care | Payer: 59 | Source: Ambulatory Visit | Attending: Internal Medicine | Admitting: Internal Medicine

## 2018-04-29 DIAGNOSIS — Z1231 Encounter for screening mammogram for malignant neoplasm of breast: Secondary | ICD-10-CM

## 2018-04-29 DIAGNOSIS — R928 Other abnormal and inconclusive findings on diagnostic imaging of breast: Secondary | ICD-10-CM

## 2018-04-29 DIAGNOSIS — N6489 Other specified disorders of breast: Secondary | ICD-10-CM

## 2018-05-10 ENCOUNTER — Ambulatory Visit
Admission: RE | Admit: 2018-05-10 | Discharge: 2018-05-10 | Disposition: A | Discharge status: Home / Self Care | Payer: 59 | Source: Ambulatory Visit | Attending: Internal Medicine | Admitting: Internal Medicine

## 2018-05-10 DIAGNOSIS — R928 Other abnormal and inconclusive findings on diagnostic imaging of breast: Secondary | ICD-10-CM

## 2018-05-10 DIAGNOSIS — N6489 Other specified disorders of breast: Secondary | ICD-10-CM | POA: Diagnosis not present

## 2018-05-10 DIAGNOSIS — R922 Inconclusive mammogram: Secondary | ICD-10-CM | POA: Diagnosis not present

## 2018-06-09 ENCOUNTER — Other Ambulatory Visit: Payer: Self-pay | Admitting: Internal Medicine

## 2018-06-24 ENCOUNTER — Telehealth: Payer: Self-pay

## 2018-06-24 NOTE — Telephone Encounter (Signed)
Copied from CRM [id number]. Topic: General - Other >> Jun 24, 2018 11:51 AM Sherri Lucas wrote: Reason for CRM: Patient is calling to stated 03-28-18 she received for incorrect coding, she is needing a call back from the practice admin. The bill is for 25$. Please advise  [phone]

## 2018-06-26 ENCOUNTER — Telehealth: Payer: Self-pay | Admitting: Internal Medicine

## 2018-06-26 NOTE — Telephone Encounter (Signed)
Spoke with patient about billing of $25 for an office visit.  Explained to patient that this billing code (86754) was done correctly and she is responsible for the amount billed to her of $25

## 2018-08-03 NOTE — Telephone Encounter (Signed)
Spoke with patient about billing of $25 for an office visit. Explained to patient that this billing code was done correctly and she is responsible for the amount billed to her of $25  For documentation only no further action required

## 2018-10-03 ENCOUNTER — Other Ambulatory Visit: Payer: Self-pay | Admitting: Internal Medicine

## 2018-10-03 DIAGNOSIS — E039 Hypothyroidism, unspecified: Secondary | ICD-10-CM

## 2018-10-03 MED ORDER — LEVOTHYROXINE SODIUM 112 MCG PO TABS: ORAL_TABLET | ORAL | 3 refills | Status: DC

## 2019-03-31 ENCOUNTER — Other Ambulatory Visit: Payer: Self-pay

## 2019-04-01 ENCOUNTER — Encounter: Payer: Self-pay | Admitting: Internal Medicine

## 2019-04-01 ENCOUNTER — Ambulatory Visit (INDEPENDENT_AMBULATORY_CARE_PROVIDER_SITE_OTHER): Payer: 59 | Admitting: Internal Medicine

## 2019-04-01 VITALS — BP 126/82 | HR 86 | Temp 97.8°F | Ht 66.5 in | Wt 172.8 lb

## 2019-04-01 DIAGNOSIS — Z0000 Encounter for general adult medical examination without abnormal findings: Secondary | ICD-10-CM | POA: Diagnosis not present

## 2019-04-01 DIAGNOSIS — Z1389 Encounter for screening for other disorder: Secondary | ICD-10-CM | POA: Diagnosis not present

## 2019-04-01 DIAGNOSIS — Z1231 Encounter for screening mammogram for malignant neoplasm of breast: Secondary | ICD-10-CM | POA: Insufficient documentation

## 2019-04-01 DIAGNOSIS — Z13220 Encounter for screening for lipoid disorders: Secondary | ICD-10-CM | POA: Diagnosis not present

## 2019-04-01 DIAGNOSIS — Z124 Encounter for screening for malignant neoplasm of cervix: Secondary | ICD-10-CM

## 2019-04-01 DIAGNOSIS — E039 Hypothyroidism, unspecified: Secondary | ICD-10-CM

## 2019-04-01 DIAGNOSIS — R739 Hyperglycemia, unspecified: Secondary | ICD-10-CM

## 2019-04-01 NOTE — Patient Instructions (Addendum)
cetaphil or cerave cream  Call when ready for colonoscopy if you want shingrix, or any of the pneumonia vaccines I.e prevnar, pneumonia 23 vaccine  Nasal sprays atrovent or astelin discussed   Postnasal Drip Postnasal drip is the feeling of mucus going down the back of your throat. Mucus is a slimy substance that moistens and cleans your nose and throat, as well as the air pockets in face bones near your forehead and cheeks (sinuses). Small amounts of mucus pass from your nose and sinuses down the back of your throat all the time. This is normal. When you produce too much mucus or the mucus gets too thick, you can feel it. Some common causes of postnasal drip include:  Having more mucus because of: ? A cold or the flu. ? Allergies. ? Cold air. ? Certain medicines.  Having more mucus that is thicker because of: ? A sinus or nasal infection. ? Dry air. ? A food allergy. Follow these instructions at home: Relieving discomfort   Gargle with a salt-water mixture 3-4 times a day or as needed. To make a salt-water mixture, completely dissolve -1 tsp of salt in 1 cup of warm water.  If the air in your home is dry, use a humidifier to add moisture to the air.  Use a saline spray or container (neti pot) to flush out the nose (nasal irrigation). These methods can help clear away mucus and keep the nasal passages moist. General instructions  Take over-the-counter and prescription medicines only as told by your health care provider.  Follow instructions from your health care provider about eating or drinking restrictions. You may need to avoid caffeine.  Avoid things that you know you are allergic to (allergens), like dust, mold, pollen, pets, or certain foods.  Drink enough fluid to keep your urine pale yellow.  Keep all follow-up visits as told by your health care provider. This is important. Contact a health care provider if:  You have a fever.  You have a sore throat.  You have  difficulty swallowing.  You have headache.  You have sinus pain.  You have a cough that does not go away.  The mucus from your nose becomes thick and is green or yellow in color.  You have cold or flu symptoms that last more than 10 days. Summary  Postnasal drip is the feeling of mucus going down the back of your throat.  If your health care provider approves, use nasal irrigation or a nasal spray 2?4 times a day.  Avoid things that you know you are allergic to (allergens), like dust, mold, pollen, pets, or certain foods. This information is not intended to replace advice given to you by your health care provider. Make sure you discuss any questions you have with your health care provider. Document Released: 08/14/2016 Document Revised: 08/23/2018 Document Reviewed: 08/14/2016 Elsevier Patient Education  2020 [employer].

## 2019-04-01 NOTE — Progress Notes (Addendum)
Chief Complaint  Patient presents with  . Annual Exam   Annual no complaints today doing well  1. Hypothyroidism on levo 112 mcg qd    Review of Systems  Constitutional: Negative for weight loss.  HENT: Negative for hearing loss.   Eyes: Negative for blurred vision.  Respiratory: Negative for shortness of breath.   Cardiovascular: Negative for chest pain.  Gastrointestinal: Negative for abdominal pain.  Musculoskeletal: Negative for falls.  Skin: Negative for rash.  Neurological: Negative for headaches.  Psychiatric/Behavioral: Negative for depression and memory loss.   Past Medical History:  Diagnosis Date  . Left knee pain   . Posterior vitreous degeneration, right   . Thyroid disease    No past surgical history on file. Family History  Problem Relation Age of Onset  . Diabetes Mother   . Cancer Father        prostate  . Cancer Sister        breast  . Breast cancer Sister 52  . Stroke Brother    Social History   Socioeconomic History  . Marital status: Married    Spouse name: Not on file  . Number of children: Not on file  . Years of education: Not on file  . Highest education level: Not on file  Occupational History  . Not on file  Social Needs  . Financial resource strain: Not on file  . Food insecurity    Worry: Not on file    Inability: Not on file  . Transportation needs    Medical: Not on file    Non-medical: Not on file  Tobacco Use  . Smoking status: Never Smoker  . Smokeless tobacco: Never Used  Substance and Sexual Activity  . Alcohol use: No  . Drug use: No  . Sexual activity: Yes    Birth control/protection: Post-menopausal  Lifestyle  . Physical activity    Days per week: Not on file    Minutes per session: Not on file  . Stress: Not on file  Relationships  . Social Herbalist on phone: Not on file    Gets together: Not on file    Attends religious service: Not on file    Active member of club or organization: Not on  file    Attends meetings of clubs or organizations: Not on file    Relationship status: Not on file  . Intimate partner violence    Fear of current or ex partner: Not on file    Emotionally abused: Not on file    Physically abused: Not on file    Forced sexual activity: Not on file  Other Topics Concern  . Not on file  Social History Narrative   Married to husband x 20 yrs    3 step kids 17 y.o son lives with her    [employer] employee    6 sisters, 1 brother deceased 59 y.o suicide    Current Meds  Medication Sig  . Ascorbic Acid (VITAMIN C) 1000 MG tablet Take 1,000 mg by mouth daily.  . [place]-Glucosamine-Vit D (OSTEO BI-FLEX ONE PER DAY PO) Take by mouth daily.  . Cholecalciferol (VITAMIN D) 50 MCG (2000 UT) tablet Take 2,000 Units by mouth daily.   . Coenzyme Q10 (COQ10) 100 MG CAPS Take 100 mg by mouth daily.  Marland Kitchen levothyroxine (SYNTHROID) 112 MCG tablet TAKE 1 TABLET BY MOUTH  DAILY BEFORE BREAKFAST.  . Magnesium 250 MG TABS Take 250 mg by mouth daily.  Marland Kitchen  Omega-3 Fatty Acids (SUPER OMEGA 3 EPA/DHA) 1000 MG CAPS Take by mouth.   No Known Allergies No results found for this or any previous visit (from the past 2160 hour(s)). Objective  Body mass index is 27.47 kg/m. Wt Readings from Last 3 Encounters:  04/01/19 172 lb 12.8 oz (78.4 kg)  03/28/18 168 lb 9.6 oz (76.5 kg)  03/22/17 173 lb (78.5 kg)   Temp Readings from Last 3 Encounters:  04/01/19 97.8 F (36.6 C) (Skin)  03/28/18 98.3 F (36.8 C) (Oral)  03/22/17 98.2 F (36.8 C) (Oral)   BP Readings from Last 3 Encounters:  04/01/19 126/82  03/28/18 132/76  03/22/17 124/80   Pulse Readings from Last 3 Encounters:  04/01/19 86  03/28/18 100  03/22/17 70    Physical Exam Vitals signs and nursing note reviewed.  Constitutional:      Appearance: Normal appearance. She is well-developed, well-groomed and overweight.     Comments: +mask on    HENT:     Head: Normocephalic and atraumatic.     Comments: Mild  wax left ear   Eyes:     Conjunctiva/sclera: Conjunctivae normal.     Pupils: Pupils are equal, round, and reactive to light.  Cardiovascular:     Rate and Rhythm: Normal rate and regular rhythm.     Heart sounds: Normal heart sounds. No murmur.  Pulmonary:     Effort: Pulmonary effort is normal.     Breath sounds: Normal breath sounds.  Chest:     Breasts: Breasts are symmetrical.        Right: Normal. No swelling, bleeding, inverted nipple, mass, nipple discharge, skin change or tenderness.        Left: Normal. No swelling, bleeding, inverted nipple, mass, nipple discharge, skin change or tenderness.  Abdominal:     General: Abdomen is flat. Bowel sounds are normal.     Tenderness: There is no abdominal tenderness.  Lymphadenopathy:     Upper Body:     Right upper body: No axillary adenopathy.     Left upper body: No axillary adenopathy.  Skin:    General: Skin is warm and dry.  Neurological:     General: No focal deficit present.     Mental Status: She is alert and oriented to person, place, and time. Mental status is at baseline.     Gait: Gait normal.  Psychiatric:        Attention and Perception: Attention and perception normal.        Mood and Affect: Mood and affect normal.        Speech: Speech normal.        Behavior: Behavior normal. Behavior is cooperative.        Thought Content: Thought content normal.        Cognition and Memory: Cognition and memory normal.        Judgment: Judgment normal.     Assessment  Plan  Annual physical exam Will get flu shot at work still not had as of 04/01/19 tdap utd  Consider shingrix declines for now will also think about prevnar and pna 23   mammo 05/10/18 negative ordered again breast exam normal today  Colonoscopy wants to wait another year had 10/05/06 IH h/o polyp  -wants to wait as of 04/01/19   dexa 02/23/15 osteopenia/porosis declines prolia was on evista x 2-3 w/o help disc strontium 500 m gqd ,vitamin D3 2000 IU  daily, mag, osteobiflex  -declines repeat 04/01/19  Pap  had 03/05/16 neg neg HPV no h/o abnormal  -pap today  Skin no issues  Hep C negative 5-6 years in ~2014 negative  [facility] f/u sch 04/2019   Hypothyroidism, unspecified type - Plan: Comprehensive metabolic panel, CBC with Differential/Platelet, TSH -will need refill after labs   Provider: Dr. French Anaracy McLean-Scocuzza-Internal Medicine

## 2019-04-02 ENCOUNTER — Other Ambulatory Visit: Payer: Self-pay | Admitting: Internal Medicine

## 2019-04-17 LAB — PAP LB (LIQUID-BASED)

## 2019-04-18 ENCOUNTER — Telehealth: Payer: Self-pay

## 2019-04-18 NOTE — Telephone Encounter (Signed)
Copied from [place] [id number]. Topic: Quick Communication - See Telephone Encounter >> Apr 18, 2019  9:29 AM Babs Bertin, CMA wrote: CRM for notification. See Telephone encounter for: 04/18/19. >> Apr 18, 2019 10:12 AM Alanda Slim E wrote: Pt returned Brocks call / please advise   called Patient and informed her of her pap results. Pt understood.  Nina,cma

## 2019-04-19 LAB — COMPREHENSIVE METABOLIC PANEL
ALT: 15 IU/L (ref 0–32)
AST: 23 IU/L (ref 0–40)
Albumin/Globulin Ratio: 1.5 (ref 1.2–2.2)
Albumin: 4.1 g/dL (ref 3.8–4.8)
Alkaline Phosphatase: 91 IU/L (ref 39–117)
BUN/Creatinine Ratio: 20 (ref 12–28)
BUN: 17 mg/dL (ref 8–27)
Bilirubin Total: 0.5 mg/dL (ref 0.0–1.2)
CO2: 21 mmol/L (ref 20–29)
Calcium: 9.4 mg/dL (ref 8.7–10.3)
Chloride: 104 mmol/L (ref 96–106)
Creatinine, Ser: 0.84 mg/dL (ref 0.57–1.00)
GFR calc Af Amer: 85 mL/min/{1.73_m2} (ref >59)
GFR calc non Af Amer: 74 mL/min/{1.73_m2} (ref >59)
Globulin, Total: 2.8 g/dL (ref 1.5–4.5)
Glucose: 99 mg/dL (ref 65–99)
Potassium: 4.3 mmol/L (ref 3.5–5.2)
Sodium: 141 mmol/L (ref 134–144)
Total Protein: 6.9 g/dL (ref 6.0–8.5)

## 2019-04-19 LAB — CBC WITH DIFFERENTIAL/PLATELET
Basophils Absolute: 0.1 10*3/uL (ref 0.0–0.2)
Basos: 1 %
EOS (ABSOLUTE): 0.3 10*3/uL (ref 0.0–0.4)
Eos: 4 %
Hematocrit: 39.2 % (ref 34.0–46.6)
Hemoglobin: 13.1 g/dL (ref 11.1–15.9)
Immature Grans (Abs): 0 10*3/uL (ref 0.0–0.1)
Immature Granulocytes: 0 %
Lymphocytes Absolute: 1.6 10*3/uL (ref 0.7–3.1)
Lymphs: 20 %
MCH: 31 pg (ref 26.6–33.0)
MCHC: 33.4 g/dL (ref 31.5–35.7)
MCV: 93 fL (ref 79–97)
Monocytes Absolute: 0.8 10*3/uL (ref 0.1–0.9)
Monocytes: 10 %
Neutrophils Absolute: 5 10*3/uL (ref 1.4–7.0)
Neutrophils: 65 %
Platelets: 222 10*3/uL (ref 150–450)
RBC: 4.22 x10E6/uL (ref 3.77–5.28)
RDW: 11.8 % (ref 11.7–15.4)
WBC: 7.8 10*3/uL (ref 3.4–10.8)

## 2019-04-19 LAB — MICROSCOPIC EXAMINATION: Casts: NONE SEEN /lpf

## 2019-04-19 LAB — LIPID PANEL
Chol/HDL Ratio: 2.2 ratio (ref 0.0–4.4)
Cholesterol, Total: 178 mg/dL (ref 100–199)
HDL: 82 mg/dL (ref >39)
LDL Chol Calc (NIH): 80 mg/dL (ref 0–99)
Triglycerides: 90 mg/dL (ref 0–149)
VLDL Cholesterol Cal: 16 mg/dL (ref 5–40)

## 2019-04-19 LAB — URINALYSIS, ROUTINE W REFLEX MICROSCOPIC
Bilirubin, UA: NEGATIVE
Glucose, UA: NEGATIVE
Ketones, UA: NEGATIVE
Nitrite, UA: NEGATIVE
Protein,UA: NEGATIVE
RBC, UA: NEGATIVE
Specific Gravity, UA: 1.021 (ref 1.005–1.030)
Urobilinogen, Ur: 1 mg/dL (ref 0.2–1.0)
pH, UA: 6.5 (ref 5.0–7.5)

## 2019-04-19 LAB — TSH: TSH: 1.54 u[IU]/mL (ref 0.450–4.500)

## 2019-05-02 LAB — SPECIMEN STATUS REPORT

## 2019-05-02 NOTE — Progress Notes (Signed)
HPV added, negative, pt. aware

## 2019-05-06 ENCOUNTER — Ambulatory Visit
Admission: RE | Admit: 2019-05-06 | Discharge: 2019-05-06 | Disposition: A | Discharge status: Home / Self Care | Payer: 59 | Source: Ambulatory Visit | Attending: Internal Medicine | Admitting: Internal Medicine

## 2019-05-06 DIAGNOSIS — Z1231 Encounter for screening mammogram for malignant neoplasm of breast: Secondary | ICD-10-CM | POA: Diagnosis present

## 2019-05-10 LAB — SPECIMEN STATUS REPORT

## 2019-05-10 LAB — HPV DNA PROBE HIGH RISK, AMPLIFIED: HPV, high-risk: NEGATIVE

## 2019-08-26 ENCOUNTER — Other Ambulatory Visit: Payer: Self-pay | Admitting: Internal Medicine

## 2019-08-26 DIAGNOSIS — E039 Hypothyroidism, unspecified: Secondary | ICD-10-CM

## 2019-08-26 MED ORDER — LEVOTHYROXINE SODIUM 112 MCG PO TABS: ORAL_TABLET | ORAL | 2 refills | Status: DC

## 2019-10-25 DIAGNOSIS — R040 Epistaxis: Secondary | ICD-10-CM

## 2019-10-25 HISTORY — DX: Epistaxis: R04.0

## 2019-11-02 ENCOUNTER — Other Ambulatory Visit: Payer: Self-pay

## 2019-11-02 ENCOUNTER — Ambulatory Visit
Admission: EM | Admit: 2019-11-02 | Discharge: 2019-11-02 | Disposition: A | Discharge status: Home / Self Care | Payer: 59

## 2019-11-02 DIAGNOSIS — R040 Epistaxis: Secondary | ICD-10-CM

## 2019-11-02 NOTE — ED Provider Notes (Signed)
Patient inquiring about work-up capabilities in urgent care setting for epistaxis.  Denying active bleeding, anticoagulant or blood thinner use. Requesting ENT information. Declining provider evaluation: Contact information provided. No charge for today's encounter.   Hall-Potvin, [place], [place] 11/02/19 1507

## 2019-11-02 NOTE — Discharge Instructions (Signed)
NO CHARGE FOR TODAY'S VISIT :)

## 2019-12-16 IMAGING — MG DIGITAL DIAGNOSTIC UNILATERAL RIGHT MAMMOGRAM WITH TOMO AND CAD
4 series · 4 of 12 positions shown · non-contrast
Comparison: Previous exam(s).

CLINICAL DATA: Right inner breast asymmetry seen on most recent
screening mammography.

EXAM:
DIGITAL DIAGNOSTIC RIGHT MAMMOGRAM WITH CAD AND TOMO
ULTRASOUND RIGHT BREAST

[R ML synth-2D]
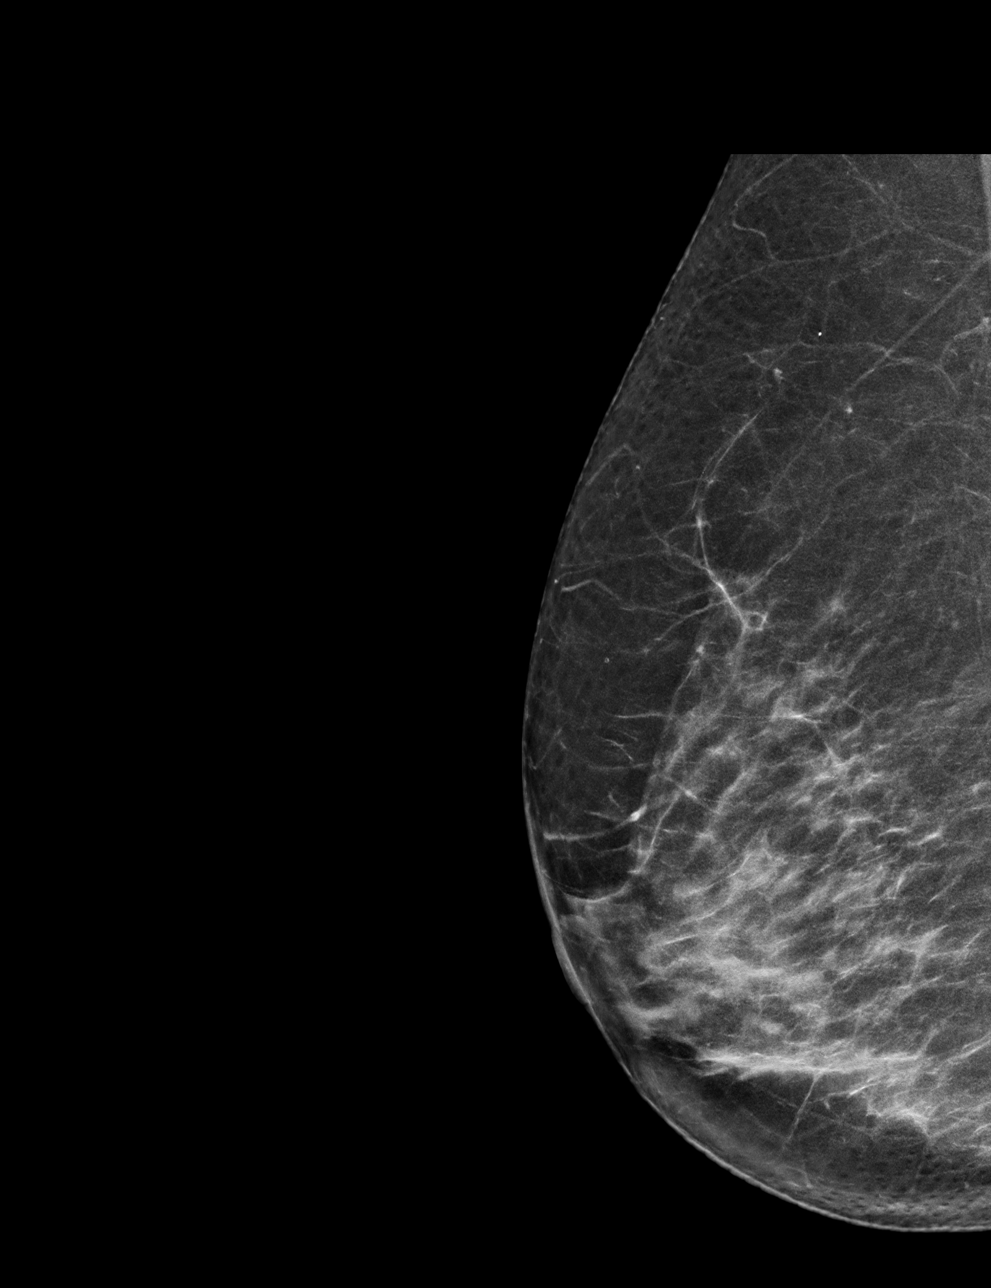

[R CC synth-2D]
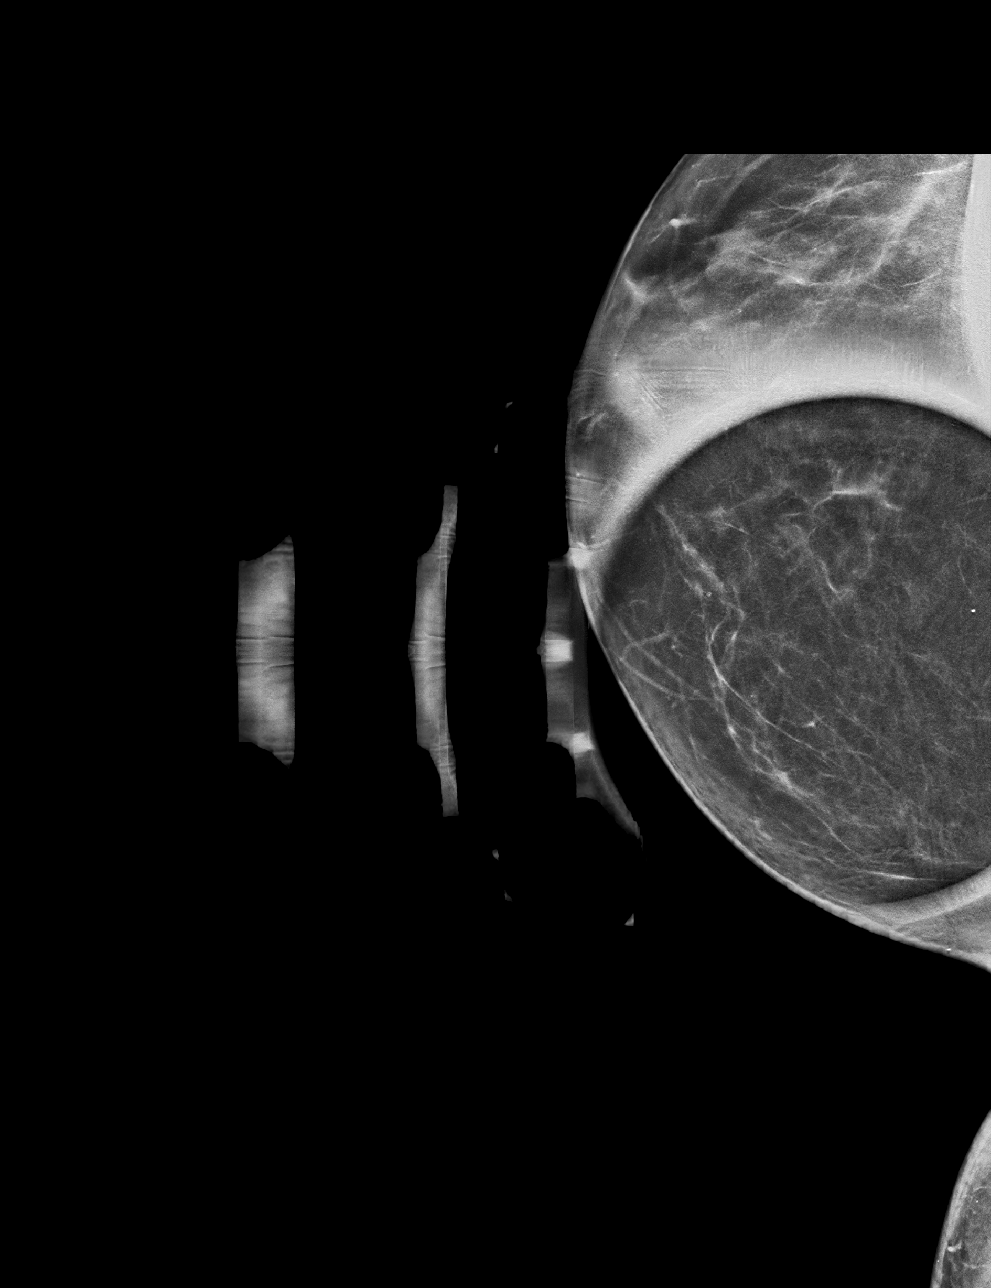

[R ML tomo · tomo slice 33/64.0]
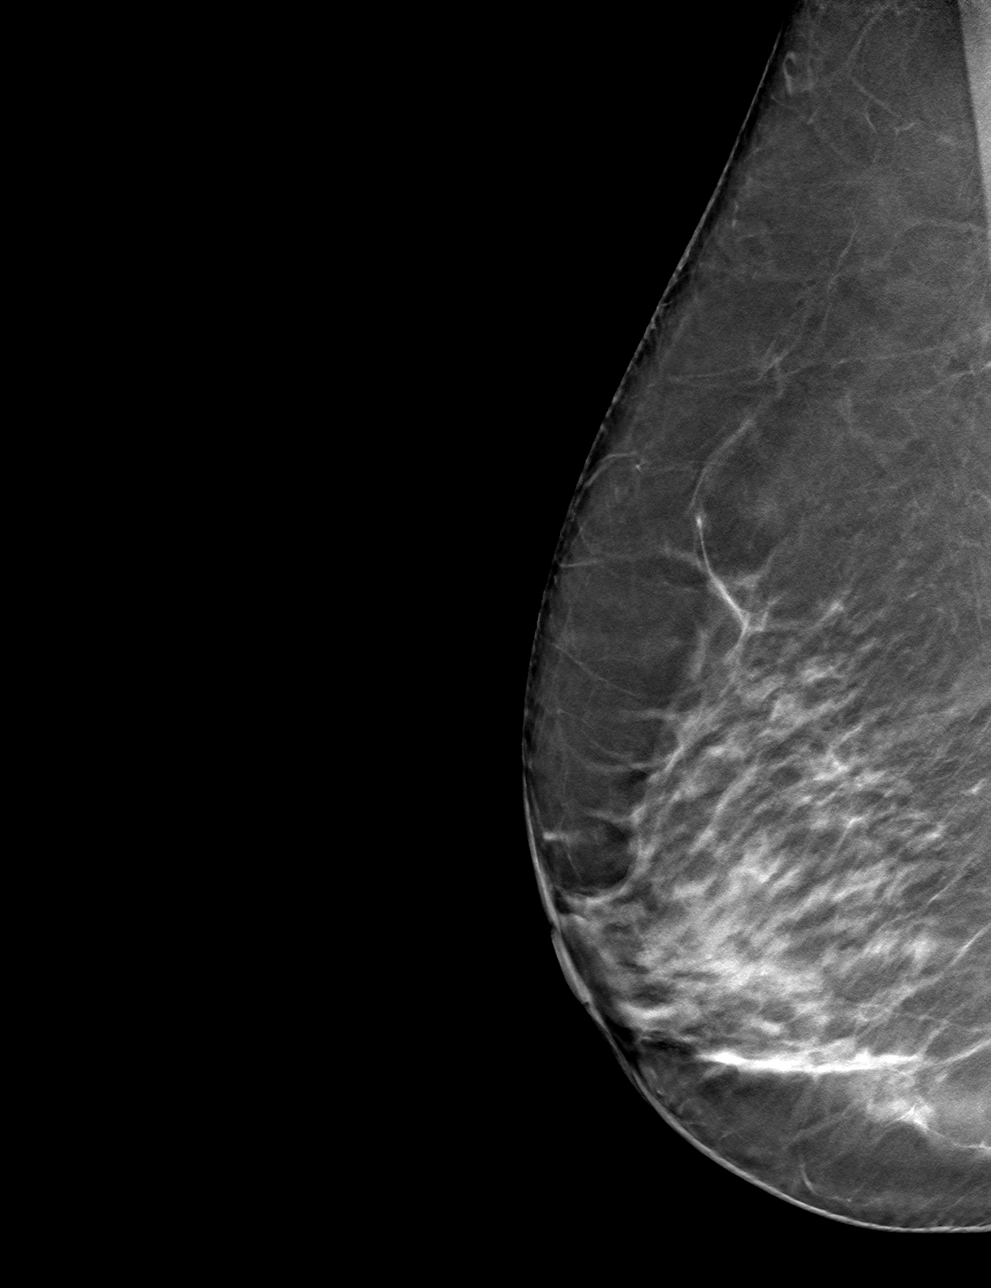

[R CC tomo · tomo slice 32/63.0]
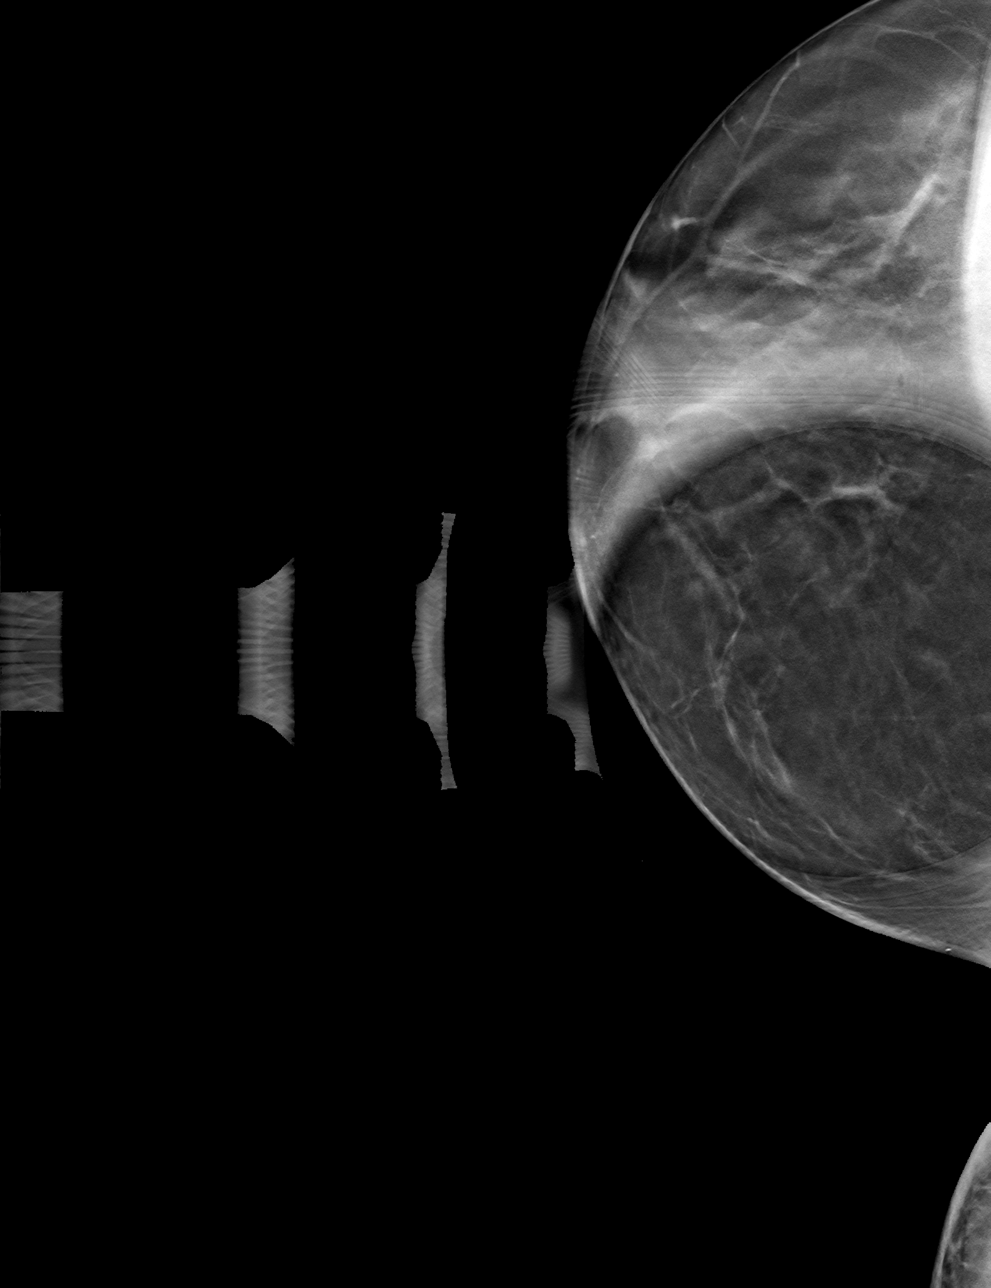

[4 of 12 positions shown; findings below may reference images not displayed]

ACR Breast Density Category c: The breast tissue is heterogeneously
dense, which may obscure small masses.
FINDINGS: Additional mammographic views of the right breast demonstrate no
suspicious masses or areas of architectural distortion. The
previously questioned asymmetry in the in her right breast effaces
to glandular tissue.

Mammographic images were processed with CAD.

On physical exam, no suspicious masses are palpated.

Targeted ultrasound is performed, showing no suspicious masses or
shadowing lesions.
IMPRESSION: No mammographic or sonographic evidence of malignancy in the right
breast.

RECOMMENDATION:
Screening mammogram in one year.(Code:[id number])

I have discussed the findings and recommendations with the patient.
Results were also provided in writing at the conclusion of the
visit. If applicable, a reminder letter will be sent to the patient
regarding the next appointment.

BI-RADS CATEGORY  1: Negative.

## 2020-03-19 ENCOUNTER — Telehealth: Payer: Self-pay | Admitting: Internal Medicine

## 2020-03-19 NOTE — Telephone Encounter (Signed)
Sheaffer-Poudrier, Colin Broach  You Yesterday (7:47 AM)  KS Patient would like to know if she needs to have labs before her up coming appointment. No lab orders in chart.    Message text   Sheaffer-Poudrier, [place], [place] Yesterday (7:46 AM)  Eula Flax I will forward this message to Dr. Shirlee Latch and see if she would like to do labs before appointment. If she does, someone from the office will call you to schedule a lab appointment. I am sorry to hear of your lose, my condolences to you.  Clydie Braun

## 2020-03-19 NOTE — Telephone Encounter (Signed)
Patient appointment for 04/02/20, Please advise on labs, last labs done 04/18/19.

## 2020-03-22 ENCOUNTER — Other Ambulatory Visit: Payer: Self-pay | Admitting: Internal Medicine

## 2020-03-22 DIAGNOSIS — Z1231 Encounter for screening mammogram for malignant neoplasm of breast: Secondary | ICD-10-CM

## 2020-03-29 NOTE — Telephone Encounter (Addendum)
She is a little early for yearly labs labs last year done 04/17/20  Does she want to push appt back and do labs 04/17/20 and have physical after labs ? If so change appt please   Labs in [facility]

## 2020-03-29 NOTE — Addendum Note (Signed)
Addended by: Quentin Ore on: 03/29/2020 09:39 AM   Modules accepted: Orders

## 2020-03-29 NOTE — Telephone Encounter (Signed)
Patient informed and verbalized understanding.  Her appointment has been moved to 05/06/20 at 12:00

## 2020-04-02 ENCOUNTER — Encounter: Payer: 59 | Admitting: Internal Medicine

## 2020-04-29 ENCOUNTER — Other Ambulatory Visit: Payer: Self-pay

## 2020-04-29 DIAGNOSIS — E039 Hypothyroidism, unspecified: Secondary | ICD-10-CM

## 2020-04-29 MED ORDER — LEVOTHYROXINE SODIUM 112 MCG PO TABS: ORAL_TABLET | ORAL | 2 refills | Status: DC

## 2020-05-06 ENCOUNTER — Ambulatory Visit (INDEPENDENT_AMBULATORY_CARE_PROVIDER_SITE_OTHER): Payer: 59 | Admitting: Internal Medicine

## 2020-05-06 ENCOUNTER — Other Ambulatory Visit: Payer: Self-pay

## 2020-05-06 ENCOUNTER — Encounter: Payer: Self-pay | Admitting: Internal Medicine

## 2020-05-06 VITALS — BP 130/76 | HR 75 | Temp 98.1°F | Ht 66.14 in | Wt 171.2 lb

## 2020-05-06 DIAGNOSIS — F32A Depression, unspecified: Secondary | ICD-10-CM

## 2020-05-06 DIAGNOSIS — F419 Anxiety disorder, unspecified: Secondary | ICD-10-CM

## 2020-05-06 DIAGNOSIS — Z0000 Encounter for general adult medical examination without abnormal findings: Secondary | ICD-10-CM

## 2020-05-06 DIAGNOSIS — E559 Vitamin D deficiency, unspecified: Secondary | ICD-10-CM

## 2020-05-06 DIAGNOSIS — Z1231 Encounter for screening mammogram for malignant neoplasm of breast: Secondary | ICD-10-CM

## 2020-05-06 DIAGNOSIS — F4321 Adjustment disorder with depressed mood: Secondary | ICD-10-CM

## 2020-05-06 NOTE — Patient Instructions (Addendum)
cerave or cetaphil cream  Consider prevnar vaccine    Call me back when you get medicare and we'll order cologuard have to mail back on Monday  -Free for Medicare   xyzal  Pneumococcal Conjugate Vaccine suspension for injection What is this medicine? PNEUMOCOCCAL VACCINE (NEU mo KOK al vak SEEN) is a vaccine used to prevent pneumococcus bacterial infections. These bacteria can cause serious infections like pneumonia, meningitis, and blood infections. This vaccine will lower your chance of getting pneumonia. If you do get pneumonia, it can make your symptoms milder and your illness shorter. This vaccine will not treat an infection and will not cause infection. This vaccine is recommended for infants and young children, adults with certain medical conditions, and adults 65 years or older. This medicine may be used for other purposes; ask your health care provider or pharmacist if you have questions. COMMON BRAND NAME(S): Prevnar, Prevnar 13 What should I tell my health care provider before I take this medicine? They need to know if you have any of these conditions:  bleeding problems  fever  immune system problems  an unusual or allergic reaction to pneumococcal vaccine, diphtheria toxoid, other vaccines, latex, other medicines, foods, dyes, or preservatives  pregnant or trying to get pregnant  breast-feeding How should I use this medicine? This vaccine is for injection into a muscle. It is given by a health care professional. A copy of Vaccine Information Statements will be given before each vaccination. Read this sheet carefully each time. The sheet may change frequently. Talk to your pediatrician regarding the use of this medicine in children. While this drug may be prescribed for children as young as 63 weeks old for selected conditions, precautions do apply. Overdosage: If you think you have taken too much of this medicine contact a poison control center or emergency room at  once. NOTE: This medicine is only for you. Do not share this medicine with others. What if I miss a dose? It is important not to miss your dose. Call your doctor or health care professional if you are unable to keep an appointment. What may interact with this medicine?  medicines for cancer chemotherapy  medicines that suppress your immune function  steroid medicines like prednisone or cortisone This list may not describe all possible interactions. Give your health care provider a list of all the medicines, herbs, non-prescription drugs, or dietary supplements you use. Also tell them if you smoke, drink alcohol, or use illegal drugs. Some items may interact with your medicine. What should I watch for while using this medicine? Mild fever and pain should go away in 3 days or less. Report any unusual symptoms to your doctor or health care professional. What side effects may I notice from receiving this medicine? Side effects that you should report to your doctor or health care professional as soon as possible:  allergic reactions like skin rash, itching or hives, swelling of the face, lips, or tongue  breathing problems  confused  fast or irregular heartbeat  fever over 102 degrees F  seizures  unusual bleeding or bruising  unusual muscle weakness Side effects that usually do not require medical attention (report to your doctor or health care professional if they continue or are bothersome):  aches and pains  diarrhea  fever of 102 degrees F or less  headache  irritable  loss of appetite  pain, tender at site where injected  trouble sleeping This list may not describe all possible side effects. Call your  doctor for medical advice about side effects. You may report side effects to FDA at 1-800-FDA-1088. Where should I keep my medicine? This does not apply. This vaccine is given in a clinic, pharmacy, doctor's office, or other health care setting and will not be stored  at home. NOTE: This sheet is a summary. It may not cover all possible information. If you have questions about this medicine, talk to your doctor, pharmacist, or health care provider.  2020 Elsevier/Gold Standard (2014-02-05 10:27:27)

## 2020-05-06 NOTE — Progress Notes (Signed)
Chief Complaint  Patient presents with  . Annual Exam   Annual  1. Anxiety and depression PHQ 5 and GAD 7 score 6 husband died 2019-06-12 but doing ok after his death trying to handle finances  Declines meds   Review of Systems  Constitutional: Negative for weight loss.  HENT: Negative for hearing loss.   Eyes: Negative for blurred vision.  Respiratory: Negative for shortness of breath.   Cardiovascular: Negative for chest pain.  Gastrointestinal: Negative for abdominal pain.  Genitourinary: Negative for dysuria.  Musculoskeletal: Negative for falls and joint pain.  Skin: Negative for rash.  Neurological: Negative for headaches.  Psychiatric/Behavioral: Negative for depression.   Past Medical History:  Diagnosis Date  . Left knee pain   . Posterior vitreous degeneration, right   . Thyroid disease    No past surgical history on file. Family History  Problem Relation Age of Onset  . Diabetes Mother   . Cancer Father        prostate  . Cancer Sister        breast  . Breast cancer Sister 63  . Stroke Brother    Social History   Socioeconomic History  . Marital status: Widowed    Spouse name: Not on file  . Number of children: Not on file  . Years of education: Not on file  . Highest education level: Not on file  Occupational History  . Not on file  Tobacco Use  . Smoking status: Never Smoker  . Smokeless tobacco: Never Used  Substance and Sexual Activity  . Alcohol use: No  . Drug use: No  . Sexual activity: Yes    Birth control/protection: Post-menopausal  Other Topics Concern  . Not on file  Social History Narrative   Married to husband x 20 yrs    Husband died 2019-06-12      3 step kids 40 y.o son lives with her    [employer] employee    6 sisters, 1 brother deceased 7 y.o suicide    Plans to retire 01/2021   Social Determinants of Health   Financial Resource Strain: Not on file  Food Insecurity: Not on file  Transportation Needs: Not on file  Physical  Activity: Not on file  Stress: Not on file  Social Connections: Not on file  Intimate Partner Violence: Not on file   Current Meds  Medication Sig  . Ascorbic Acid (VITAMIN C) 1000 MG tablet Take 1,000 mg by mouth daily.  . Boswellia-Glucosamine-Vit D (OSTEO BI-FLEX ONE PER DAY PO) Take by mouth daily.  . Cholecalciferol (VITAMIN D) 50 MCG (2000 UT) tablet Take 2,000 Units by mouth daily.   . Coenzyme Q10 (COQ10) 100 MG CAPS Take 100 mg by mouth daily.  Marland Kitchen levothyroxine (SYNTHROID) 112 MCG tablet TAKE 1 TABLET BY MOUTH  DAILY BEFORE BREAKFAST.  . Magnesium 250 MG TABS Take 250 mg by mouth daily.  . Omega-3 Fatty Acids (SUPER OMEGA 3 EPA/DHA) 1000 MG CAPS Take by mouth.   No Known Allergies No results found for this or any previous visit (from the past 2160 hour(s)). Objective  Body mass index is 27.51 kg/m. Wt Readings from Last 3 Encounters:  05/06/20 171 lb 3.2 oz (77.7 kg)  04/01/19 172 lb 12.8 oz (78.4 kg)  03/28/18 168 lb 9.6 oz (76.5 kg)   Temp Readings from Last 3 Encounters:  05/06/20 98.1 F (36.7 C) (Oral)  04/01/19 97.8 F (36.6 C) (Skin)  03/28/18 98.3 F (36.8 C) (Oral)  BP Readings from Last 3 Encounters:  05/06/20 130/76  04/01/19 126/82  03/28/18 132/76   Pulse Readings from Last 3 Encounters:  05/06/20 75  04/01/19 86  03/28/18 100    Physical Exam Vitals and nursing note reviewed.  Constitutional:      Appearance: Normal appearance. She is well-developed, well-groomed and overweight.  HENT:     Head: Normocephalic and atraumatic.  Eyes:     Conjunctiva/sclera: Conjunctivae normal.     Pupils: Pupils are equal, round, and reactive to light.  Cardiovascular:     Rate and Rhythm: Normal rate and regular rhythm.     Heart sounds: Normal heart sounds. No murmur heard.   Pulmonary:     Effort: Pulmonary effort is normal.     Breath sounds: Normal breath sounds.  Skin:    General: Skin is warm and dry.  Neurological:     General: No focal  deficit present.     Mental Status: She is alert and oriented to person, place, and time. Mental status is at baseline.     Gait: Gait normal.  Psychiatric:        Attention and Perception: Attention and perception normal.        Mood and Affect: Mood and affect normal.        Speech: Speech normal.        Behavior: Behavior normal. Behavior is cooperative.        Thought Content: Thought content normal.        Cognition and Memory: Cognition and memory normal.        Judgment: Judgment normal.     Assessment  Plan  Annual physical exam Flu declines covid 2/2 disc booster  tdap due Rx for this and shingrix given 05/06/20  declines for now will also think about prevnar and pna 23   mammo 05/10/18 negative ordered again breast exam normal today sch 05/10/20   Colonoscopy wants to wait another year had 10/05/06 IH h/o polyp  -wants to wait as of 12/23/21reviewed cologuard will call back for this   dexa 02/23/15 osteopenia/porosis declines prolia was on evista x 2-3 w/o help disc strontium 500 m gqd ,vitamin D3 2000 IU daily, mag, osteobiflex  -declines repeat 04/01/19   Pap had 04/02/2019  neg neg HPV no h/o abnormal   Skin no issues  Hep C negative 5-6 years in ~2014 negative   [facility] f/u sch 04/2019 due 05/2020  Vitamin D deficiency - Plan: Vitamin D (25 hydroxy)  Anxiety and depression Grief  Declines meds      Provider: Dr. French Ana McLean-Scocuzza-Internal Medicine

## 2020-05-10 ENCOUNTER — Other Ambulatory Visit: Payer: Self-pay

## 2020-05-10 ENCOUNTER — Ambulatory Visit
Admission: RE | Admit: 2020-05-10 | Discharge: 2020-05-10 | Disposition: A | Discharge status: Home / Self Care | Payer: 59 | Source: Ambulatory Visit | Attending: Internal Medicine | Admitting: Internal Medicine

## 2020-05-10 DIAGNOSIS — Z1231 Encounter for screening mammogram for malignant neoplasm of breast: Secondary | ICD-10-CM | POA: Insufficient documentation

## 2020-06-02 ENCOUNTER — Telehealth: Payer: Self-pay | Admitting: Internal Medicine

## 2020-06-02 NOTE — Telephone Encounter (Signed)
Call pt hope she is well pick up [facility] fasting labs orders (needs orders so please pick up) and do asap   Thank you

## 2020-06-04 NOTE — Telephone Encounter (Signed)
Orders were mailed to patient The morning of 06/03/2020 per instructions written on Labs by Dr. French Ana.

## 2020-06-04 NOTE — Telephone Encounter (Signed)
Do you see these orders on my desk? If not could you please print these can call the Patient?

## 2020-07-09 LAB — MICROSCOPIC EXAMINATION
Casts: NONE SEEN /lpf
Epithelial Cells (non renal): >10 /hpf — AB (ref 0–10)
RBC, Urine: NONE SEEN /hpf (ref 0–2)

## 2020-07-09 LAB — COMPREHENSIVE METABOLIC PANEL
ALT: 12 IU/L (ref 0–32)
AST: 19 IU/L (ref 0–40)
Albumin/Globulin Ratio: 1.7 (ref 1.2–2.2)
Albumin: 4.4 g/dL (ref 3.8–4.8)
Alkaline Phosphatase: 90 IU/L (ref 44–121)
BUN/Creatinine Ratio: 17 (ref 12–28)
BUN: 14 mg/dL (ref 8–27)
Bilirubin Total: 0.9 mg/dL (ref 0.0–1.2)
CO2: 23 mmol/L (ref 20–29)
Calcium: 9.9 mg/dL (ref 8.7–10.3)
Chloride: 103 mmol/L (ref 96–106)
Creatinine, Ser: 0.81 mg/dL (ref 0.57–1.00)
GFR calc Af Amer: 88 mL/min/{1.73_m2} (ref >59)
GFR calc non Af Amer: 76 mL/min/{1.73_m2} (ref >59)
Globulin, Total: 2.6 g/dL (ref 1.5–4.5)
Glucose: 94 mg/dL (ref 65–99)
Potassium: 4.6 mmol/L (ref 3.5–5.2)
Sodium: 140 mmol/L (ref 134–144)
Total Protein: 7 g/dL (ref 6.0–8.5)

## 2020-07-09 LAB — CBC WITH DIFFERENTIAL/PLATELET
Basophils Absolute: 0.1 10*3/uL (ref 0.0–0.2)
Basos: 1 %
EOS (ABSOLUTE): 0.2 10*3/uL (ref 0.0–0.4)
Eos: 4 %
Hematocrit: 42.6 % (ref 34.0–46.6)
Hemoglobin: 14.8 g/dL (ref 11.1–15.9)
Immature Grans (Abs): 0 10*3/uL (ref 0.0–0.1)
Immature Granulocytes: 0 %
Lymphocytes Absolute: 2.4 10*3/uL (ref 0.7–3.1)
Lymphs: 44 %
MCH: 32 pg (ref 26.6–33.0)
MCHC: 34.7 g/dL (ref 31.5–35.7)
MCV: 92 fL (ref 79–97)
Monocytes Absolute: 0.5 10*3/uL (ref 0.1–0.9)
Monocytes: 9 %
Neutrophils Absolute: 2.3 10*3/uL (ref 1.4–7.0)
Neutrophils: 42 %
Platelets: 243 10*3/uL (ref 150–450)
RBC: 4.62 x10E6/uL (ref 3.77–5.28)
RDW: 11.6 % — ABNORMAL LOW (ref 11.7–15.4)
WBC: 5.5 10*3/uL (ref 3.4–10.8)

## 2020-07-09 LAB — URINALYSIS, ROUTINE W REFLEX MICROSCOPIC
Bilirubin, UA: NEGATIVE
Glucose, UA: NEGATIVE
Ketones, UA: NEGATIVE
Nitrite, UA: NEGATIVE
Protein,UA: NEGATIVE
RBC, UA: NEGATIVE
Specific Gravity, UA: 1.013 (ref 1.005–1.030)
Urobilinogen, Ur: 0.2 mg/dL (ref 0.2–1.0)
pH, UA: 7.5 (ref 5.0–7.5)

## 2020-07-09 LAB — LIPID PANEL
Chol/HDL Ratio: 2.7 ratio (ref 0.0–4.4)
Cholesterol, Total: 220 mg/dL — ABNORMAL HIGH (ref 100–199)
HDL: 82 mg/dL (ref >39)
LDL Chol Calc (NIH): 123 mg/dL — ABNORMAL HIGH (ref 0–99)
Triglycerides: 86 mg/dL (ref 0–149)
VLDL Cholesterol Cal: 15 mg/dL (ref 5–40)

## 2020-07-09 LAB — HEMOGLOBIN A1C
Est. average glucose Bld gHb Est-mCnc: 114 mg/dL
Hgb A1c MFr Bld: 5.6 % (ref 4.8–5.6)

## 2020-07-09 LAB — VITAMIN D 25 HYDROXY (VIT D DEFICIENCY, FRACTURES): Vit D, 25-Hydroxy: 43 ng/mL (ref 30.0–100.0)

## 2020-07-09 LAB — TSH: TSH: 0.985 u[IU]/mL (ref 0.450–4.500)

## 2021-01-06 ENCOUNTER — Other Ambulatory Visit: Payer: Self-pay | Admitting: Internal Medicine

## 2021-01-06 DIAGNOSIS — E039 Hypothyroidism, unspecified: Secondary | ICD-10-CM

## 2021-04-12 ENCOUNTER — Other Ambulatory Visit: Payer: Self-pay | Admitting: Internal Medicine

## 2021-05-03 ENCOUNTER — Telehealth: Payer: Self-pay | Admitting: Internal Medicine

## 2021-05-03 NOTE — Telephone Encounter (Signed)
Patient called in was trying to schedule mammogram at [facility] where she normally goes but they are now requesting a referral to be sent over. Please call patient [phone]

## 2021-05-04 NOTE — Telephone Encounter (Signed)
Last mammogram done 05/11/20. Patient needing a new order, Please advise

## 2021-05-04 NOTE — Telephone Encounter (Signed)
Mammogram order in  Keep appt with me as sch

## 2021-05-04 NOTE — Telephone Encounter (Signed)
Patient informed and verbalized understanding

## 2021-05-04 NOTE — Addendum Note (Signed)
Addended by: Quentin Ore on: 05/04/2021 08:08 AM   Modules accepted: Orders

## 2021-06-01 ENCOUNTER — Other Ambulatory Visit: Payer: Self-pay

## 2021-06-01 ENCOUNTER — Ambulatory Visit (INDEPENDENT_AMBULATORY_CARE_PROVIDER_SITE_OTHER): Payer: PPO | Admitting: Internal Medicine

## 2021-06-01 ENCOUNTER — Encounter: Payer: Self-pay | Admitting: Internal Medicine

## 2021-06-01 VITALS — BP 116/76 | HR 78 | Temp 97.4°F | Ht 65.2 in | Wt 167.0 lb

## 2021-06-01 DIAGNOSIS — E038 Other specified hypothyroidism: Secondary | ICD-10-CM | POA: Diagnosis not present

## 2021-06-01 DIAGNOSIS — R739 Hyperglycemia, unspecified: Secondary | ICD-10-CM | POA: Diagnosis not present

## 2021-06-01 DIAGNOSIS — E039 Hypothyroidism, unspecified: Secondary | ICD-10-CM | POA: Diagnosis not present

## 2021-06-01 DIAGNOSIS — Z13220 Encounter for screening for lipoid disorders: Secondary | ICD-10-CM

## 2021-06-01 DIAGNOSIS — M6289 Other specified disorders of muscle: Secondary | ICD-10-CM

## 2021-06-01 DIAGNOSIS — E559 Vitamin D deficiency, unspecified: Secondary | ICD-10-CM

## 2021-06-01 DIAGNOSIS — Z0000 Encounter for general adult medical examination without abnormal findings: Secondary | ICD-10-CM

## 2021-06-01 DIAGNOSIS — Z1329 Encounter for screening for other suspected endocrine disorder: Secondary | ICD-10-CM

## 2021-06-01 DIAGNOSIS — R319 Hematuria, unspecified: Secondary | ICD-10-CM

## 2021-06-01 DIAGNOSIS — Z1211 Encounter for screening for malignant neoplasm of colon: Secondary | ICD-10-CM | POA: Diagnosis not present

## 2021-06-01 MED ORDER — LEVOTHYROXINE SODIUM 112 MCG PO TABS: 112.0000 ug | ORAL_TABLET | Freq: Every day | ORAL | 3 refills | Status: DC

## 2021-06-01 NOTE — Progress Notes (Signed)
Chief Complaint  Patient presents with   Annual Exam   Annual doing well  1. C/o pnd doing claritin and prn flonase declines atrovent  2. Skin tag left under arm let me know if dermatology appt wanted  3. C/o pelvic floor weakness disc kegels let know if referral wanted PT  Review of Systems  Constitutional:  Negative for weight loss.  HENT:  Negative for hearing loss.   Eyes:  Negative for blurred vision.  Respiratory:  Negative for shortness of breath.   Cardiovascular:  Negative for chest pain.  Gastrointestinal:  Negative for abdominal pain and blood in stool.  Genitourinary:  Negative for dysuria.  Musculoskeletal:  Negative for falls and joint pain.  Skin:  Negative for rash.  Neurological:  Negative for headaches.  Psychiatric/Behavioral:  Negative for depression.   Past Medical History:  Diagnosis Date   Left knee pain    Posterior vitreous degeneration, right    Thyroid disease    No past surgical history on file. Family History  Problem Relation Age of Onset   Diabetes Mother    Cancer Father        prostate   Cancer Sister        breast   Breast cancer Sister 25   Stroke Brother    Social History   Socioeconomic History   Marital status: Widowed    Spouse name: Not on file   Number of children: Not on file   Years of education: Not on file   Highest education level: Not on file  Occupational History   Not on file  Tobacco Use   Smoking status: Never   Smokeless tobacco: Never  Substance and Sexual Activity   Alcohol use: No   Drug use: No   Sexual activity: Yes    Birth control/protection: Post-menopausal  Other Topics Concern   Not on file  Social History Narrative   Married to husband x 20 yrs    Husband died 18-Jun-2019      3 step kids 74 y.o son lives with her    [employer] employee    6 sisters, 1 brother deceased 36 y.o suicide    Plans to retire 01/2021   Social Determinants of Health   Financial Resource Strain: Not on file  Food  Insecurity: Not on file  Transportation Needs: Not on file  Physical Activity: Not on file  Stress: Not on file  Social Connections: Not on file  Intimate Partner Violence: Not on file   Current Meds  Medication Sig   Ascorbic Acid (VITAMIN C) 1000 MG tablet Take 1,000 mg by mouth daily.   Boswellia-Glucosamine-Vit D (OSTEO BI-FLEX ONE PER DAY PO) Take by mouth daily.   Cholecalciferol (VITAMIN D) 50 MCG (2000 UT) tablet Take 2,000 Units by mouth daily.    Coenzyme Q10 (COQ10) 100 MG CAPS Take 100 mg by mouth daily.   COLLAGEN PO Take by mouth.   fluticasone (FLONASE) 50 MCG/ACT nasal spray Place into both nostrils daily.   levothyroxine (SYNTHROID) 112 MCG tablet TAKE 1 TABLET BY MOUTH  DAILY BEFORE BREAKFAST   Magnesium 250 MG TABS Take 250 mg by mouth daily.   Omega-3 Fatty Acids (SUPER OMEGA 3 EPA/DHA) 1000 MG CAPS Take by mouth.   Strontium Chloride POWD by Does not apply route.   No Known Allergies No results found for this or any previous visit (from the past 2160 hour(s)). Objective  Body mass index is 27.62 kg/m. Wt Readings from Last  3 Encounters:  06/01/21 167 lb (75.8 kg)  05/06/20 171 lb 3.2 oz (77.7 kg)  04/01/19 172 lb 12.8 oz (78.4 kg)   Temp Readings from Last 3 Encounters:  06/01/21 (!) 97.4 F (36.3 C) (Temporal)  05/06/20 98.1 F (36.7 C) (Oral)  04/01/19 97.8 F (36.6 C) (Skin)   BP Readings from Last 3 Encounters:  06/01/21 116/76  05/06/20 130/76  04/01/19 126/82   Pulse Readings from Last 3 Encounters:  06/01/21 78  05/06/20 75  04/01/19 86    Physical Exam Vitals and nursing note reviewed.  Constitutional:      Appearance: Normal appearance. She is well-developed and well-groomed.  HENT:     Head: Normocephalic and atraumatic.  Eyes:     Conjunctiva/sclera: Conjunctivae normal.     Pupils: Pupils are equal, round, and reactive to light.  Cardiovascular:     Rate and Rhythm: Normal rate and regular rhythm.     Heart sounds: Normal  heart sounds. No murmur heard. Pulmonary:     Effort: Pulmonary effort is normal.     Breath sounds: Normal breath sounds.  Chest:     Chest wall: No mass.  Breasts:    Breasts are symmetrical.     Right: Normal.     Left: Normal.  Abdominal:     General: Abdomen is flat. Bowel sounds are normal.     Tenderness: There is no abdominal tenderness.  Musculoskeletal:        General: No tenderness.  Lymphadenopathy:     Upper Body:     Right upper body: No axillary adenopathy.     Left upper body: No axillary adenopathy.  Skin:    General: Skin is warm and dry.  Neurological:     General: No focal deficit present.     Mental Status: She is alert and oriented to person, place, and time. Mental status is at baseline.     Cranial Nerves: Cranial nerves 2-12 are intact.     Gait: Gait is intact.  Psychiatric:        Attention and Perception: Attention and perception normal.        Mood and Affect: Mood and affect normal.        Speech: Speech normal.        Behavior: Behavior normal. Behavior is cooperative.        Thought Content: Thought content normal.        Cognition and Memory: Cognition and memory normal.        Judgment: Judgment normal.    Assessment  Plan  Annual physical exam - Plan: Comprehensive metabolic panel, Lipid panel, CBC with Differential/Platelet, TSH, Urinalysis, Routine w reflex microscopic, Hemoglobin A1c, Vitamin D (25 hydroxy), Urine Culture See below   Pelvic floor dysfunction Kegels  Consider PT at [facility]  Encounter for screening colonoscopy - Plan: Ambulatory referral to Gastroenterology   Hematuria, unspecified type - Plan: Urinalysis, Routine w reflex microscopic, Urine Culture  Let me know if you want to see dermatology for tags   Let me know if pelvic PT at [facility] Let me know when ready for colonoscopy  MD Physician   [facility] call for appt  Phone Fax E-mail Address  [phone] Not available [address]   [address]    [place] [id number]              HM Fasting labs [facility] 07/08/21  Flu declines covid 2/2 disc booster  tdap due Rx  for this and shingrix given 05/06/20 not had as of 06/01/21 declines declines for now will also think about prevnar and pna 22    mammo 05/10/18 negative ordered again breast exam normal today sch 05/10/20  Sch 06/10/21     Colonoscopy had 10/05/06 IH h/o polyp  Referred [facility]   dexa 02/23/15 osteopenia/porosis declines prolia was on evista x 2-3 w/o help disc strontium 500 m gqd ,vitamin D3 2000 IU daily, mag, osteobiflex  -declines repeat 04/01/19    Pap had 04/02/2019  neg neg HPV no h/o abnormal out of age window   Skin no issues tag left axilla consider dermatology   Hep C negative 5-6 years in ~2014 negative    [facility] utd 2022  Provider: Dr. Olivia Mackie McLean-Scocuzza-Internal Medicine

## 2021-06-01 NOTE — Patient Instructions (Addendum)
Let me know if you want to see dermatology for tags   Let me know if pelvic PT at [facility] Let me know when ready for colonoscopy  MD Physician   [facility] call for appt  Phone Fax E-mail Address  [phone] Not available [address]   [address]   [place] [id number]             Kegel Exercises Kegel exercises can help strengthen your pelvic floor muscles. The pelvic floor is a group of muscles that support your rectum, small intestine, and bladder. In females, pelvic floor muscles also help support the uterus. These muscles help you control the flow of urine and stool (feces). Kegel exercises are painless and simple. They do not require any equipment. Your provider may suggest Kegel exercises to: Improve bladder and bowel control. Improve sexual response. Improve weak pelvic floor muscles after surgery to remove the uterus (hysterectomy) or after pregnancy, in females. Improve weak pelvic floor muscles after prostate gland removal or surgery, in males. Kegel exercises involve squeezing your pelvic floor muscles. These are the same muscles you squeeze when you try to stop the flow of urine or keep from passing gas. The exercises can be done while sitting, standing, or lying down, but it is best to vary your position. Ask your health care provider which exercises are safe for you. Do exercises exactly as told by your health care provider and adjust them as directed. Do not begin these exercises until told by your health care provider. Exercises How to do Kegel exercises: Squeeze your pelvic floor muscles tight. You should feel a tight lift in your rectal area. If you are a female, you should also feel a tightness in your vaginal area. Keep your stomach, buttocks, and legs relaxed. Hold the muscles tight for up to 10 seconds. Breathe normally. Relax your muscles for up to 10 seconds. Repeat as told by your health care provider. Repeat this exercise daily as told by your  health care provider. Continue to do this exercise for at least 4-6 weeks, or for as long as told by your health care provider. You may be referred to a physical therapist who can help you learn more about how to do Kegel exercises. Depending on your condition, your health care provider may recommend: Varying how long you squeeze your muscles. Doing several sets of exercises every day. Doing exercises for several weeks. Making Kegel exercises a part of your regular exercise routine. This information is not intended to replace advice given to you by your health care provider. Make sure you discuss any questions you have with your health care provider. Document Revised: 09/09/2020 Document Reviewed: 09/09/2020 Elsevier Patient Education  2022 [place] Drip Postnasal drip is the feeling of mucus going down the back of your throat. Mucus is a slimy substance that moistens and cleans your nose and throat, as well as the air pockets in face bones near your forehead and cheeks (sinuses). Small amounts of mucus pass from your nose and sinuses down the back of your throat all the time. This is normal. When you produce too much mucus or the mucus gets too thick, you can feel it. Some common causes of postnasal drip include: Having more mucus because of: A cold or the flu. Allergies. Cold air. Certain medicines. Having more mucus that is thicker because of: A sinus or nasal infection. Dry air. A food allergy. Follow these instructions at home: Relieving  discomfort  Gargle with a salt-water mixture 3-4 times a day or as needed. To make a salt-water mixture, completely dissolve -1 tsp of salt in 1 cup of warm water. If the air in your home is dry, use a humidifier to add moisture to the air. Use a saline spray or container (neti pot) to flush out the nose (nasal irrigation). These methods can help clear away mucus and keep the nasal passages moist. General instructions Take  over-the-counter and prescription medicines only as told by your health care provider. Follow instructions from your health care provider about eating or drinking restrictions. You may need to avoid caffeine. Avoid things that you know you are allergic to (allergens), like dust, mold, pollen, pets, or certain foods. Drink enough fluid to keep your urine pale yellow. Keep all follow-up visits as told by your health care provider. This is important. Contact a health care provider if: You have a fever. You have a sore throat. You have difficulty swallowing. You have headache. You have sinus pain. You have a cough that does not go away. The mucus from your nose becomes thick and is green or yellow in color. You have cold or flu symptoms that last more than 10 days. Summary Postnasal drip is the feeling of mucus going down the back of your throat. If your health care provider approves, use nasal irrigation or a nasal spray 2?4 times a day. Avoid things that you know you are allergic to (allergens), like dust, mold, pollen, pets, or certain foods. This information is not intended to replace advice given to you by your health care provider. Make sure you discuss any questions you have with your health care provider. Document Revised: 02/10/2020 Document Reviewed: 02/10/2020 Elsevier Patient Education  2022 [employer].

## 2021-06-10 ENCOUNTER — Ambulatory Visit
Admission: RE | Admit: 2021-06-10 | Discharge: 2021-06-10 | Disposition: A | Discharge status: Home / Self Care | Payer: PPO | Source: Ambulatory Visit | Attending: Internal Medicine | Admitting: Internal Medicine

## 2021-06-10 ENCOUNTER — Other Ambulatory Visit: Payer: Self-pay

## 2021-06-10 DIAGNOSIS — Z1231 Encounter for screening mammogram for malignant neoplasm of breast: Secondary | ICD-10-CM | POA: Diagnosis not present

## 2021-07-09 ENCOUNTER — Ambulatory Visit
Admission: RE | Admit: 2021-07-09 | Discharge: 2021-07-09 | Disposition: A | Discharge status: Home / Self Care | Payer: PPO | Source: Ambulatory Visit | Attending: Internal Medicine | Admitting: Internal Medicine

## 2021-07-09 ENCOUNTER — Other Ambulatory Visit: Payer: Self-pay

## 2021-07-09 VITALS — BP 148/85 | HR 92 | Temp 98.5°F

## 2021-07-09 DIAGNOSIS — H109 Unspecified conjunctivitis: Secondary | ICD-10-CM | POA: Diagnosis not present

## 2021-07-09 DIAGNOSIS — J018 Other acute sinusitis: Secondary | ICD-10-CM

## 2021-07-09 DIAGNOSIS — B9689 Other specified bacterial agents as the cause of diseases classified elsewhere: Secondary | ICD-10-CM

## 2021-07-09 MED ORDER — ERYTHROMYCIN 5 MG/GM OP OINT: TOPICAL_OINTMENT | OPHTHALMIC | 0 refills | Status: DC

## 2021-07-09 MED ORDER — AMOXICILLIN 875 MG PO TABS: 875.0000 mg | ORAL_TABLET | Freq: Two times a day (BID) | ORAL | 0 refills | Status: AC

## 2021-07-09 NOTE — ED Provider Notes (Signed)
[name]    CSN: [id number] Arrival date & time: 07/09/21  0843      History   Chief Complaint Chief Complaint  Patient presents with   sinus things    HPI Sherri Lucas is a 66 y.o. female.   Patient presents with nasal congestion that has been present for approximately 2 weeks.  She reports that her mucus has become thicker and changed in color over the past few days.  She denies any known fevers or sick contacts.  Denies chest pain, shortness of breath, cough, sore throat, ear pain, nausea, vomiting, diarrhea, abdominal pain.  She does endorse that she had some bilateral eye drainage over the past few days as well.  She denies any blurry vision.  Denies trauma or foreign body to the eye.  Patient does not wear contacts.    Past Medical History:  Diagnosis Date   Left knee pain    Posterior vitreous degeneration, right    Thyroid disease     Patient Active Problem List   Diagnosis Date Noted   Annual physical exam 04/01/2019   Hypothyroidism 03/09/2014   Osteoporosis 03/09/2014    History reviewed. No pertinent surgical history.  OB History   No obstetric history on file.      Home Medications    Prior to Admission medications   Medication Sig Start Date End Date Taking? Authorizing Provider  amoxicillin (AMOXIL) 875 MG tablet Take 1 tablet (875 mg total) by mouth 2 (two) times daily for 10 days. 07/09/21 07/19/21 Yes Jeannene Tschetter, Acie Fredrickson, FNP  erythromycin ophthalmic ointment Place a 1/2 inch ribbon of ointment into the lower eyelid 4 times daily for 7 days. 07/09/21  Yes Quanell Loughney, Rolly Salter E, FNP  Ascorbic Acid (VITAMIN C) 1000 MG tablet Take 1,000 mg by mouth daily.    [provider]  Boswellia-Glucosamine-Vit D (OSTEO BI-FLEX ONE PER DAY PO) Take by mouth daily.    [provider]  Cholecalciferol (VITAMIN D) 50 MCG (2000 UT) tablet Take 2,000 Units by mouth daily.     [provider]  Coenzyme Q10 (COQ10) 100 MG CAPS Take  100 mg by mouth daily.    [provider]  COLLAGEN PO Take by mouth.    [provider]  fluticasone (FLONASE) 50 MCG/ACT nasal spray Place into both nostrils daily.    [provider]  levothyroxine (SYNTHROID) 112 MCG tablet Take 1 tablet (112 mcg total) by mouth daily before breakfast. 06/01/21   McLean-Scocuzza, Pasty Spillers, MD  Magnesium 250 MG TABS Take 250 mg by mouth daily.    [provider]  Omega-3 Fatty Acids (SUPER OMEGA 3 EPA/DHA) 1000 MG CAPS Take by mouth.    [provider]  Strontium Chloride POWD by Does not apply route.    [provider]    Family History Family History  Problem Relation Age of Onset   Diabetes Mother    Cancer Father        prostate   Cancer Sister        breast   Breast cancer Sister 38   Stroke Brother     Social History Social History   Tobacco Use   Smoking status: Never   Smokeless tobacco: Never  Substance Use Topics   Alcohol use: No   Drug use: No     Allergies   Patient has no known allergies.   Review of Systems Review of Systems Per HPI  Physical Exam Triage Vital  Signs ED Triage Vitals  Enc Vitals Group     BP 07/09/21 0915 (!) 148/85     Pulse Rate 07/09/21 0915 92     Resp --      Temp 07/09/21 0915 98.5 F (36.9 C)     Temp Source 07/09/21 0915 Oral     SpO2 07/09/21 0915 97 %     Weight --      Height --      Head Circumference --      Peak Flow --      Pain Score 07/09/21 0916 0     Pain Loc --      Pain Edu? --      Excl. in GC? --    No data found.  Updated Vital Signs BP (!) 148/85 (BP Location: Left Arm)    Pulse 92    Temp 98.5 F (36.9 C) (Oral)    LMP 05/15/2005    SpO2 97%   Visual Acuity Right Eye Distance:   Left Eye Distance:   Bilateral Distance:    Right Eye Near:   Left Eye Near:    Bilateral Near:     Physical Exam Constitutional:      General: She is not in acute distress.    Appearance: Normal appearance. She is not  toxic-appearing or diaphoretic.  HENT:     Head: Normocephalic and atraumatic.     Right Ear: Tympanic membrane and ear canal normal.     Left Ear: Tympanic membrane and ear canal normal.     Nose: Congestion present.     Mouth/Throat:     Mouth: Mucous membranes are moist.     Pharynx: No posterior oropharyngeal erythema.  Eyes:     General: Lids are normal. Lids are everted, no foreign bodies appreciated. Vision grossly intact. Gaze aligned appropriately.     Extraocular Movements: Extraocular movements intact.     Conjunctiva/sclera:     Right eye: Right conjunctiva is injected. No chemosis, exudate or hemorrhage.    Left eye: Left conjunctiva is injected. Exudate present. No chemosis or hemorrhage.    Pupils: Pupils are equal, round, and reactive to light.  Cardiovascular:     Rate and Rhythm: Normal rate and regular rhythm.     Pulses: Normal pulses.     Heart sounds: Normal heart sounds.  Pulmonary:     Effort: Pulmonary effort is normal. No respiratory distress.     Breath sounds: Normal breath sounds. No stridor. No wheezing, rhonchi or rales.  Abdominal:     General: Abdomen is flat. Bowel sounds are normal.     Palpations: Abdomen is soft.  Musculoskeletal:        General: Normal range of motion.     Cervical back: Normal range of motion.  Skin:    General: Skin is warm and dry.  Neurological:     General: No focal deficit present.     Mental Status: She is alert and oriented to person, place, and time. Mental status is at baseline.  Psychiatric:        Mood and Affect: Mood normal.        Behavior: Behavior normal.     UC Treatments / Results  Labs (all labs ordered are listed, but only abnormal results are displayed) Labs Reviewed - No data to display  EKG   Radiology No results found.  Procedures Procedures (including critical care time)  Medications Ordered in UC Medications - No data to display  Initial Impression / Assessment and Plan / UC  Course  I have reviewed the triage vital signs and the nursing notes.  Pertinent labs & imaging results that were available during my care of the patient were reviewed by me and considered in my medical decision making (see chart for details).     Will treat acute sinus infection with amoxicillin antibiotic.  Patient also has bilateral bacterial conjunctivitis so I am treating with erythromycin ointment.  Discussed supportive care and symptom management with patient.  Discussed return precautions.  Do not think that chest imaging is necessary given no adventitious lung sounds on exam.  Patient verbalized understanding and was agreeable with plan. Final Clinical Impressions(s) / UC Diagnoses   Final diagnoses:  Acute non-recurrent sinusitis of other sinus  Bacterial conjunctivitis of both eyes     Discharge Instructions      You have an upper respiratory infection that is being treated with amoxicillin antibiotic.  Please follow-up if symptoms persist or worsen.    ED Prescriptions     Medication Sig Dispense Auth. Provider   amoxicillin (AMOXIL) 875 MG tablet Take 1 tablet (875 mg total) by mouth 2 (two) times daily for 10 days. 20 tablet [place], [place] E, [place]   erythromycin ophthalmic ointment Place a 1/2 inch ribbon of ointment into the lower eyelid 4 times daily for 7 days. 3.5 g Gustavus Bryant, [place]      PDMP not reviewed this encounter.   Gustavus Bryant, [place] 07/09/21 [id number]

## 2021-07-09 NOTE — Discharge Instructions (Signed)
You have an upper respiratory infection that is being treated with amoxicillin antibiotic.  Please follow-up if symptoms persist or worsen.

## 2021-07-09 NOTE — ED Triage Notes (Signed)
Pt c/o cough, severe nasal drainage and congestion. States sinuses in face hurt, eye redness. Onset over a week ago.

## 2021-07-19 ENCOUNTER — Other Ambulatory Visit: Payer: Self-pay | Admitting: Internal Medicine

## 2021-07-19 ENCOUNTER — Encounter: Payer: Self-pay | Admitting: Internal Medicine

## 2021-07-19 DIAGNOSIS — Z13220 Encounter for screening for lipoid disorders: Secondary | ICD-10-CM | POA: Diagnosis not present

## 2021-07-19 DIAGNOSIS — Z0000 Encounter for general adult medical examination without abnormal findings: Secondary | ICD-10-CM | POA: Diagnosis not present

## 2021-07-19 DIAGNOSIS — E039 Hypothyroidism, unspecified: Secondary | ICD-10-CM | POA: Diagnosis not present

## 2021-07-19 DIAGNOSIS — R739 Hyperglycemia, unspecified: Secondary | ICD-10-CM | POA: Diagnosis not present

## 2021-07-19 DIAGNOSIS — E559 Vitamin D deficiency, unspecified: Secondary | ICD-10-CM | POA: Diagnosis not present

## 2021-07-19 DIAGNOSIS — Z1231 Encounter for screening mammogram for malignant neoplasm of breast: Secondary | ICD-10-CM | POA: Diagnosis not present

## 2021-07-20 LAB — VITAMIN D 25 HYDROXY (VIT D DEFICIENCY, FRACTURES): Vit D, 25-Hydroxy: 46.2 ng/mL (ref 30.0–100.0)

## 2021-07-20 LAB — HEMOGLOBIN A1C
Est. average glucose Bld gHb Est-mCnc: 126 mg/dL
Hgb A1c MFr Bld: 6 % — ABNORMAL HIGH (ref 4.8–5.6)

## 2021-07-20 LAB — COMPREHENSIVE METABOLIC PANEL
ALT: 20 IU/L (ref 0–32)
AST: 25 IU/L (ref 0–40)
Albumin/Globulin Ratio: 1.3 (ref 1.2–2.2)
Albumin: 4.2 g/dL (ref 3.8–4.8)
Alkaline Phosphatase: 97 IU/L (ref 44–121)
BUN/Creatinine Ratio: 14 (ref 12–28)
BUN: 12 mg/dL (ref 8–27)
Bilirubin Total: 0.4 mg/dL (ref 0.0–1.2)
CO2: 25 mmol/L (ref 20–29)
Calcium: 9.7 mg/dL (ref 8.7–10.3)
Chloride: 99 mmol/L (ref 96–106)
Creatinine, Ser: 0.87 mg/dL (ref 0.57–1.00)
Globulin, Total: 3.2 g/dL (ref 1.5–4.5)
Glucose: 97 mg/dL (ref 70–99)
Potassium: 4.6 mmol/L (ref 3.5–5.2)
Sodium: 139 mmol/L (ref 134–144)
Total Protein: 7.4 g/dL (ref 6.0–8.5)
eGFR: 73 mL/min/{1.73_m2} (ref >59)

## 2021-07-20 LAB — CBC WITH DIFFERENTIAL/PLATELET
Basophils Absolute: 0.1 10*3/uL (ref 0.0–0.2)
Basos: 2 %
EOS (ABSOLUTE): 0.2 10*3/uL (ref 0.0–0.4)
Eos: 4 %
Hematocrit: 41.1 % (ref 34.0–46.6)
Hemoglobin: 13.3 g/dL (ref 11.1–15.9)
Immature Grans (Abs): 0 10*3/uL (ref 0.0–0.1)
Immature Granulocytes: 0 %
Lymphocytes Absolute: 2.2 10*3/uL (ref 0.7–3.1)
Lymphs: 43 %
MCH: 30.6 pg (ref 26.6–33.0)
MCHC: 32.4 g/dL (ref 31.5–35.7)
MCV: 95 fL (ref 79–97)
Monocytes Absolute: 0.5 10*3/uL (ref 0.1–0.9)
Monocytes: 9 %
Neutrophils Absolute: 2.2 10*3/uL (ref 1.4–7.0)
Neutrophils: 42 %
Platelets: 418 10*3/uL (ref 150–450)
RBC: 4.34 x10E6/uL (ref 3.77–5.28)
RDW: 11.6 % — ABNORMAL LOW (ref 11.7–15.4)
WBC: 5.2 10*3/uL (ref 3.4–10.8)

## 2021-07-20 LAB — LIPID PANEL
Chol/HDL Ratio: 3.4 ratio (ref 0.0–4.4)
Cholesterol, Total: 197 mg/dL (ref 100–199)
HDL: 58 mg/dL (ref >39)
LDL Chol Calc (NIH): 117 mg/dL — ABNORMAL HIGH (ref 0–99)
Triglycerides: 127 mg/dL (ref 0–149)
VLDL Cholesterol Cal: 22 mg/dL (ref 5–40)

## 2021-07-20 LAB — URINALYSIS, ROUTINE W REFLEX MICROSCOPIC

## 2021-07-20 LAB — TSH: TSH: 0.754 u[IU]/mL (ref 0.450–4.500)

## 2021-07-25 ENCOUNTER — Other Ambulatory Visit: Payer: Self-pay

## 2021-07-25 DIAGNOSIS — Z1389 Encounter for screening for other disorder: Secondary | ICD-10-CM

## 2021-07-25 DIAGNOSIS — Z0000 Encounter for general adult medical examination without abnormal findings: Secondary | ICD-10-CM

## 2022-02-22 ENCOUNTER — Ambulatory Visit (INDEPENDENT_AMBULATORY_CARE_PROVIDER_SITE_OTHER): Payer: PPO

## 2022-02-22 VITALS — Ht 65.2 in | Wt 167.0 lb

## 2022-02-22 DIAGNOSIS — Z1211 Encounter for screening for malignant neoplasm of colon: Secondary | ICD-10-CM | POA: Diagnosis not present

## 2022-02-22 DIAGNOSIS — Z0000 Encounter for general adult medical examination without abnormal findings: Secondary | ICD-10-CM | POA: Diagnosis not present

## 2022-02-22 NOTE — Progress Notes (Signed)
Subjective:   Sherri Lucas is a 66 y.o. female who presents for an Initial Medicare Annual Wellness Visit.  Review of Systems    No ROS.  Medicare Wellness Virtual Visit.  Visual/audio telehealth visit, UTA vital signs.   See social history for additional risk factors.   Cardiac Risk Factors include: advanced age (>46men, >51 women);hypertension     Objective:    Today's Vitals   02/22/22 0935  Weight: 167 lb (75.8 kg)  Height: 5' 5.2" (1.656 m)   Body mass index is 27.62 kg/m.     02/22/2022    9:52 AM  Advanced Directives  Does Patient Have a Medical Advance Directive? Yes  Type of Advance Directive Healthcare Power of Attorney  Does patient want to make changes to medical advance directive? No - Patient declined    Current Medications (verified) Outpatient Encounter Medications as of 02/22/2022  Medication Sig   Boswellia-Glucosamine-Vit D (OSTEO BI-FLEX ONE PER DAY PO) Take by mouth daily.   Cholecalciferol (VITAMIN D) 50 MCG (2000 UT) tablet Take 2,000 Units by mouth daily.    Coenzyme Q10 (COQ10) 100 MG CAPS Take 100 mg by mouth daily.   COLLAGEN PO Take by mouth.   fluticasone (FLONASE) 50 MCG/ACT nasal spray Place into both nostrils daily.   levothyroxine (SYNTHROID) 112 MCG tablet Take 1 tablet (112 mcg total) by mouth daily before breakfast.   Magnesium 250 MG TABS Take 250 mg by mouth daily.   Omega-3 Fatty Acids (SUPER OMEGA 3 EPA/DHA) 1000 MG CAPS Take by mouth.   Strontium Chloride POWD by Does not apply route.   [DISCONTINUED] Ascorbic Acid (VITAMIN C) 1000 MG tablet Take 1,000 mg by mouth daily.   [DISCONTINUED] erythromycin ophthalmic ointment Place a 1/2 inch ribbon of ointment into the lower eyelid 4 times daily for 7 days.   No facility-administered encounter medications on file as of 02/22/2022.    Allergies (verified) Patient has no known allergies.   History: Past Medical History:  Diagnosis Date   Left knee pain    Posterior  vitreous degeneration, right    Thyroid disease    History reviewed. No pertinent surgical history. Family History  Problem Relation Age of Onset   Diabetes Mother    Cancer Father        prostate   Cancer Sister        breast   Breast cancer Sister 9   Stroke Brother    Social History   Socioeconomic History   Marital status: Widowed    Spouse name: Not on file   Number of children: Not on file   Years of education: Not on file   Highest education level: Not on file  Occupational History   Not on file  Tobacco Use   Smoking status: Never   Smokeless tobacco: Never  Substance and Sexual Activity   Alcohol use: No   Drug use: No   Sexual activity: Yes    Birth control/protection: Post-menopausal  Other Topics Concern   Not on file  Social History Narrative   Married to husband x 20 yrs    Husband died 06/10/19      3 step kids 41 y.o son lives with her    [employer] employee    6 sisters, 1 brother deceased 83 y.o suicide    Plans to retire 01/2021   Social Determinants of Health   Financial Resource Strain: Low Risk  (02/22/2022)   Overall Financial Resource Strain (CARDIA)  Difficulty of Paying Living Expenses: Not hard at all  Food Insecurity: No Food Insecurity (02/22/2022)   Hunger Vital Sign    Worried About Running Out of Food in the Last Year: Never true    Ran Out of Food in the Last Year: Never true  Transportation Needs: No Transportation Needs (02/22/2022)   PRAPARE - Administrator, Civil Service (Medical): No    Lack of Transportation (Non-Medical): No  Physical Activity: Sufficiently Active (02/22/2022)   Exercise Vital Sign    Days of Exercise per Week: 5 days    Minutes of Exercise per Session: 30 min  Stress: No Stress Concern Present (02/22/2022)   [employer] of Occupational Health - Occupational Stress Questionnaire    Feeling of Stress : Not at all  Social Connections: Unknown (02/22/2022)   Social Connection and  Isolation Panel [NHANES]    Frequency of Communication with Friends and Family: More than three times a week    Frequency of Social Gatherings with Friends and Family: More than three times a week    Attends Religious Services: Not on Marketing executive or Organizations: Not on file    Attends Banker Meetings: Not on file    Marital Status: Not on file    Tobacco Counseling Counseling given: Not Answered   Clinical Intake:  Pre-visit preparation completed: Yes        Diabetes: No  How often do you need to have someone help you when you read instructions, pamphlets, or other written materials from your doctor or pharmacy?: 1 - Never   Interpreter Needed?: No      Activities of Daily Living    02/22/2022    9:36 AM  In your present state of health, do you have any difficulty performing the following activities:  Hearing? 0  Vision? 0  Difficulty concentrating or making decisions? 0  Walking or climbing stairs? 0  Dressing or bathing? 0  Doing errands, shopping? 0  Preparing Food and eating ? N  Using the Toilet? N  In the past six months, have you accidently leaked urine? N  Do you have problems with loss of bowel control? N  Managing your Medications? N  Managing your Finances? N  Housekeeping or managing your Housekeeping? N    Patient Care Team: McLean-Scocuzza, Pasty Spillers, MD as PCP - General (Internal Medicine)  Indicate any recent Medical Services you may have received from other than [facility] providers in the past year (date may be approximate).     Assessment:   This is a routine wellness examination for Sherri Lucas.  I connected with  Sherri Lucas on 02/22/22 by a audio enabled telemedicine application and verified that I am speaking with the correct person using two identifiers.  Patient Location: Home  Provider Location: Office/Clinic  I discussed the limitations of evaluation and management by telemedicine. The patient  expressed understanding and agreed to proceed.   Hearing/Vision screen Hearing Screening - Comments:: Patient is able to hear conversational tones without difficulty.  No issues reported. Vision Screening - Comments:: Followed by [facility] W[facility] corrective lenses They have seen their ophthalmologist in the last 12 months.    Dietary issues and exercise activities discussed: Current Exercise Habits: Home exercise routine, Type of exercise: stretching;walking, Time (Minutes): 30, Frequency (Times/Week): 5, Weekly Exercise (Minutes/Week): 150, Intensity: Mild Regular diet Monitors sugar intake Good water intake   Goals Addressed  This Visit's Progress     Patient Stated     Weight goal 150lb (pt-stated)        Stay active Healthy diet/low carb        Depression Screen    02/22/2022    9:40 AM 06/01/2021    1:06 PM 05/06/2020    1:14 PM 03/28/2018    9:03 AM 03/22/2017    3:00 PM 03/13/2013    9:25 AM  PHQ 2/9 Scores  PHQ - 2 Score 0 0 1 0 0 0  PHQ- 9 Score   5       Fall Risk    02/22/2022    9:43 AM 06/01/2021    1:06 PM 05/06/2020   12:22 PM 03/28/2018    9:03 AM 03/22/2017    3:00 PM  [place] in the past year? 0 0 0 0 No  Number falls in past yr: 0 0 0    Injury with Fall? 0 0 0    Risk for fall due to : No Fall Risks No Fall Risks     Follow up Falls evaluation completed Falls evaluation completed Falls evaluation completed      Staves: Home free of loose throw rugs in walkways, pet beds, electrical cords, etc? Yes  Adequate lighting in your home to reduce risk of falls? Yes   ASSISTIVE DEVICES UTILIZED TO PREVENT FALLS: Life alert? No  Use of a cane, walker or w/c? No  Grab bars in the bathroom? No  Shower chair or bench in shower? No  Elevated toilet seat or a handicapped toilet? No   TIMED UP AND GO: Was the test performed? No .    Cognitive Function:        02/22/2022     9:57 AM  6CIT Screen  What Year? 0 points  What month? 0 points  What time? 0 points  Count back from 20 0 points  Months in reverse 0 points  Repeat phrase 0 points  Total Score 0 points    Immunizations Immunization History  Administered Date(s) Administered   PFIZER Comirnaty(Gray Top)Covid-19 Tri-Sucrose Vaccine 09/23/2020   PFIZER(Purple Top)SARS-COV-2 Vaccination 01/24/2020, 02/14/2020   Tdap 04/28/2009   Flu Vaccine status: Due, Education has been provided regarding the importance of this vaccine. Advised may receive this vaccine at local pharmacy or Health Dept. Aware to provide a copy of the vaccination record if obtained from local pharmacy or Health Dept. Verbalized acceptance and understanding.  Shingrix Completed?: No.    Education has been provided regarding the importance of this vaccine. Patient has been advised to call insurance company to determine out of pocket expense if they have not yet received this vaccine. Advised may also receive vaccine at local pharmacy or Health Dept. Verbalized acceptance and understanding.  Screening Tests Health Maintenance  Topic Date Due   Fecal DNA (Cologuard)  Never done   Zoster Vaccines- Shingrix (1 of 2) 05/25/2022 (Originally 09/19/2004)   Pneumonia Vaccine 63+ Years old (1 - PCV) 06/01/2022 (Originally 09/20/2019)   TETANUS/TDAP  06/01/2022 (Originally 04/29/2019)   COVID-19 Vaccine (4 - Pfizer series) 06/15/2022 (Originally 11/18/2020)   INFLUENZA VACCINE  08/13/2022 (Originally 12/13/2021)   MAMMOGRAM  06/11/2023   DEXA SCAN  Completed   Hepatitis C Screening  Completed   HPV VACCINES  Aged Out   COLONOSCOPY (Pts 45-91yrs Insurance coverage will need to be confirmed)  [place] Maintenance  Due  Topic Date Due   Fecal DNA (Cologuard)  Never done   Cologuard- reordered per patient consent.    Lung Cancer Screening: (Low Dose CT Chest recommended if Age 7-80 years, 30 pack-year currently  smoking OR have quit w/in 15years.) does not qualify.   Hepatitis C Screening: Completed 2012.   Vision Screening: Recommended annual ophthalmology exams for early detection of glaucoma and other disorders of the eye.  Dental Screening: Recommended annual dental exams for proper oral hygiene. Visits every 6 months.   Community Resource Referral / Chronic Care Management: CRR required this visit?  No   CCM required this visit?  No      Plan:     I have personally reviewed and noted the following in the patient's chart:   Medical and social history Use of alcohol, tobacco or illicit drugs  Current medications and supplements including opioid prescriptions. Patient is not currently taking opioid prescriptions. Functional ability and status Nutritional status Physical activity Advanced directives List of other physicians Hospitalizations, surgeries, and ER visits in previous 12 months Vitals Screenings to include cognitive, depression, and falls Referrals and appointments  In addition, I have reviewed and discussed with patient certain preventive protocols, quality metrics, and best practice recommendations. A written personalized care plan for preventive services as well as general preventive health recommendations were provided to patient.     Ashok Pall, LPN   [id number]

## 2022-02-22 NOTE — Patient Instructions (Addendum)
[name] , Thank you for taking time to come for your Medicare Wellness Visit. I appreciate your ongoing commitment to your health goals. Please review the following plan we discussed and let me know if I can assist you in the future.   These are the goals we discussed:  Goals       Patient Stated     Weight goal 150lb (pt-stated)      Stay active Healthy diet/low carb         This is a list of the screening recommended for you and due dates:  Health Maintenance  Topic Date Due   Cologuard (Stool DNA test)  Never done   Zoster (Shingles) Vaccine (1 of 2) 05/25/2022*   Pneumonia Vaccine (1 - PCV) 06/01/2022*   Tetanus Vaccine  06/01/2022*   COVID-19 Vaccine (4 - Pfizer series) 06/15/2022*   Flu Shot  08/13/2022*   Mammogram  06/11/2023   DEXA scan (bone density measurement)  Completed   Hepatitis C Screening: USPSTF Recommendation to screen - Ages 8-79 yo.  Completed   HPV Vaccine  Aged Out   Colon Cancer Screening  Discontinued  *Topic was postponed. The date shown is not the original due date.   Cologuard- reordered per consent. Discuss more at next follow up.   Advanced directives: End of life planning; Advance aging; Advanced directives discussed.  Copy of current HCPOA/Living Will requested.    Conditions/risks identified: none new.  Next appointment: Follow up in one year for your annual wellness visit    Preventive Care 65 Years and Older, Female Preventive care refers to lifestyle choices and visits with your health care provider that can promote health and wellness. What does preventive care include? A yearly physical exam. This is also called an annual well check. Dental exams once or twice a year. Routine eye exams. Ask your health care provider how often you should have your eyes checked. Personal lifestyle choices, including: Daily care of your teeth and gums. Regular physical activity. Eating a healthy diet. Avoiding tobacco and drug use. Limiting  alcohol use. Practicing safe sex. Taking low-dose aspirin every day. Taking vitamin and mineral supplements as recommended by your health care provider. What happens during an annual well check? The services and screenings done by your health care provider during your annual well check will depend on your age, overall health, lifestyle risk factors, and family history of disease. Counseling  Your health care provider may ask you questions about your: Alcohol use. Tobacco use. Drug use. Emotional well-being. Home and relationship well-being. Sexual activity. Eating habits. History of falls. Memory and ability to understand (cognition). Work and work Statistician. Reproductive health. Screening  You may have the following tests or measurements: Height, weight, and BMI. Blood pressure. Lipid and cholesterol levels. These may be checked every 5 years, or more frequently if you are over 63 years old. Skin check. Lung cancer screening. You may have this screening every year starting at age 81 if you have a 30-pack-year history of smoking and currently smoke or have quit within the past 15 years. Fecal occult blood test (FOBT) of the stool. You may have this test every year starting at age 79. Flexible sigmoidoscopy or colonoscopy. You may have a sigmoidoscopy every 5 years or a colonoscopy every 10 years starting at age 35. Hepatitis C blood test. Hepatitis B blood test. Sexually transmitted disease (STD) testing. Diabetes screening. This is done by checking your blood sugar (glucose) after you have not eaten  for a while (fasting). You may have this done every 1-3 years. Bone density scan. This is done to screen for osteoporosis. You may have this done starting at age 41. Mammogram. This may be done every 1-2 years. Talk to your health care provider about how often you should have regular mammograms. Talk with your health care provider about your test results, treatment options, and if  necessary, the need for more tests. Vaccines  Your health care provider may recommend certain vaccines, such as: Influenza vaccine. This is recommended every year. Tetanus, diphtheria, and acellular pertussis (Tdap, Td) vaccine. You may need a Td booster every 10 years. Zoster vaccine. You may need this after age 71. Pneumococcal 13-valent conjugate (PCV13) vaccine. One dose is recommended after age 70. Pneumococcal polysaccharide (PPSV23) vaccine. One dose is recommended after age 30. Talk to your health care provider about which screenings and vaccines you need and how often you need them. This information is not intended to replace advice given to you by your health care provider. Make sure you discuss any questions you have with your health care provider. Document Released: 05/28/2015 Document Revised: 01/19/2016 Document Reviewed: 03/02/2015 Elsevier Interactive Patient Education  2017 [place] Prevention in the Home Falls can cause injuries. They can happen to people of all ages. There are many things you can do to make your home safe and to help prevent falls. What can I do on the outside of my home? Regularly fix the edges of walkways and driveways and fix any cracks. Remove anything that might make you trip as you walk through a door, such as a raised step or threshold. Trim any bushes or trees on the path to your home. Use bright outdoor lighting. Clear any walking paths of anything that might make someone trip, such as rocks or tools. Regularly check to see if handrails are loose or broken. Make sure that both sides of any steps have handrails. Any raised decks and porches should have guardrails on the edges. Have any leaves, snow, or ice cleared regularly. Use sand or salt on walking paths during winter. Clean up any spills in your garage right away. This includes oil or grease spills. What can I do in the bathroom? Use night lights. Install grab bars by the toilet  and in the tub and shower. Do not use towel bars as grab bars. Use non-skid mats or decals in the tub or shower. If you need to sit down in the shower, use a plastic, non-slip stool. Keep the floor dry. Clean up any water that spills on the floor as soon as it happens. Remove soap buildup in the tub or shower regularly. Attach bath mats securely with double-sided non-slip rug tape. Do not have throw rugs and other things on the floor that can make you trip. What can I do in the bedroom? Use night lights. Make sure that you have a light by your bed that is easy to reach. Do not use any sheets or blankets that are too big for your bed. They should not hang down onto the floor. Have a firm chair that has side arms. You can use this for support while you get dressed. Do not have throw rugs and other things on the floor that can make you trip. What can I do in the kitchen? Clean up any spills right away. Avoid walking on wet floors. Keep items that you use a lot in easy-to-reach places. If you need to reach something  above you, use a strong step stool that has a grab bar. Keep electrical cords out of the way. Do not use floor polish or wax that makes floors slippery. If you must use wax, use non-skid floor wax. Do not have throw rugs and other things on the floor that can make you trip. What can I do with my stairs? Do not leave any items on the stairs. Make sure that there are handrails on both sides of the stairs and use them. Fix handrails that are broken or loose. Make sure that handrails are as long as the stairways. Check any carpeting to make sure that it is firmly attached to the stairs. Fix any carpet that is loose or worn. Avoid having throw rugs at the top or bottom of the stairs. If you do have throw rugs, attach them to the floor with carpet tape. Make sure that you have a light switch at the top of the stairs and the bottom of the stairs. If you do not have them, ask someone to add  them for you. What else can I do to help prevent falls? Wear shoes that: Do not have high heels. Have rubber bottoms. Are comfortable and fit you well. Are closed at the toe. Do not wear sandals. If you use a stepladder: Make sure that it is fully opened. Do not climb a closed stepladder. Make sure that both sides of the stepladder are locked into place. Ask someone to hold it for you, if possible. Clearly mark and make sure that you can see: Any grab bars or handrails. First and last steps. Where the edge of each step is. Use tools that help you move around (mobility aids) if they are needed. These include: Canes. Walkers. Scooters. Crutches. Turn on the lights when you go into a dark area. Replace any light bulbs as soon as they burn out. Set up your furniture so you have a clear path. Avoid moving your furniture around. If any of your floors are uneven, fix them. If there are any pets around you, be aware of where they are. Review your medicines with your doctor. Some medicines can make you feel dizzy. This can increase your chance of falling. Ask your doctor what other things that you can do to help prevent falls. This information is not intended to replace advice given to you by your health care provider. Make sure you discuss any questions you have with your health care provider. Document Released: 02/25/2009 Document Revised: 10/07/2015 Document Reviewed: 06/05/2014 Elsevier Interactive Patient Education  2017 [employer].

## 2022-03-28 DIAGNOSIS — Z1211 Encounter for screening for malignant neoplasm of colon: Secondary | ICD-10-CM | POA: Diagnosis not present

## 2022-03-29 ENCOUNTER — Ambulatory Visit (INDEPENDENT_AMBULATORY_CARE_PROVIDER_SITE_OTHER): Payer: PPO | Admitting: Family Medicine

## 2022-03-29 ENCOUNTER — Encounter: Payer: Self-pay | Admitting: Family Medicine

## 2022-03-29 VITALS — BP 124/82 | HR 72 | Temp 98.1°F | Ht 65.2 in | Wt 184.0 lb

## 2022-03-29 DIAGNOSIS — M810 Age-related osteoporosis without current pathological fracture: Secondary | ICD-10-CM

## 2022-03-29 DIAGNOSIS — E039 Hypothyroidism, unspecified: Secondary | ICD-10-CM | POA: Diagnosis not present

## 2022-03-29 DIAGNOSIS — E038 Other specified hypothyroidism: Secondary | ICD-10-CM

## 2022-03-29 DIAGNOSIS — Z0000 Encounter for general adult medical examination without abnormal findings: Secondary | ICD-10-CM

## 2022-03-29 NOTE — Progress Notes (Signed)
   SUBJECTIVE:   Chief Complaint  Patient presents with   Establish Care    Transfer of Care- Dr French Ana   HPI Patient presents to clinic to transfer care  No acute concerns today  Hypothyroidism Asymptomatic.  Takes levothyroxine on 112 mcg daily and tolerating medication well.  Had thyroid checked earlier this year normal.  Osteoporosis Previous DEXA scan in 2016 showing osteoporosis.  Currently taking cholecalciferol.  No recent history of falls or fractures  PERTINENT PMH / PSH: Hypothyroidism Osteoporosis   OBJECTIVE:  BP 124/82 (BP Location: Left Arm, Patient Position: Sitting, Cuff Size: Normal)   Pulse 72   Temp 98.1 F (36.7 C) (Oral)   Ht 5' 5.2" (1.656 m)   Wt 184 lb (83.5 kg)   LMP 05/15/2005   SpO2 99%   BMI 30.43 kg/m    Physical Exam Vitals reviewed.  Constitutional:      General: She is not in acute distress.    Appearance: She is not ill-appearing.  HENT:     Head: Normocephalic.     Nose: Nose normal.  Eyes:     Conjunctiva/sclera: Conjunctivae normal.  Neck:     Thyroid: No thyroid mass or thyromegaly.  Cardiovascular:     Rate and Rhythm: Normal rate and regular rhythm.     Heart sounds: Normal heart sounds.  Pulmonary:     Effort: Pulmonary effort is normal.     Breath sounds: Normal breath sounds.  Abdominal:     General: Abdomen is flat. Bowel sounds are normal.     Palpations: Abdomen is soft.  Musculoskeletal:        General: Normal range of motion.     Cervical back: Normal range of motion.  Neurological:     Mental Status: She is alert and oriented to person, place, and time. Mental status is at baseline.  Psychiatric:        Mood and Affect: Mood normal.        Behavior: Behavior normal.        Thought Content: Thought content normal.        Judgment: Judgment normal.     ASSESSMENT/PLAN:  Hypothyroidism, unspecified type Assessment & Plan: Chronic.  Asymptomatic.  Last TSH 07/2021.  Normal limits. Continue  levothyroxine 112 mcg daily Repeat TSH annually  Orders: -     TSH  Annual physical exam -     CBC with Differential/Platelet -     Vitamin B12 -     Lipid panel -     Hemoglobin A1c  Age-related osteoporosis without current pathological fracture Assessment & Plan: Chronic.  No recent falls or fractures.  Prefers to stay on calcium and vitamin D supplementation.   Last DEXA scan in 2016 Plan to discuss repeat DEXA scan with patient at annual visit, if worsening osteoporosis can revisit discussion on bisphosphonates or other options for treatment.    Orders: -     VITAMIN D 25 Hydroxy (Vit-D Deficiency, Fractures) -     Comprehensive metabolic panel -     Magnesium   Requisition for fasting labs given to patient to have completed at [facility] prior to annual visit  PDMP reviewed  Return in about 3 months (around 06/29/2022).  Dana Allan, MD

## 2022-03-29 NOTE — Patient Instructions (Addendum)
It was a pleasure meeting you today. Thank you for allowing me to take part in your health care.  Our goals for today as we discussed include:  Please fast for 12 hours prior to lab appointment  For your thyroid Continue Levothyroxine 112 mcg daily   Please follow-up with PCP in 2-3  months for annual physical   If you have any questions or concerns, please do not hesitate to call the office at [phone].  I look forward to our next visit and until then take care and stay safe.  Regards,   Dana Allan, MD   [facility]

## 2022-04-10 LAB — COLOGUARD: COLOGUARD: NEGATIVE

## 2022-04-12 ENCOUNTER — Encounter: Payer: Self-pay | Admitting: Family Medicine

## 2022-04-15 ENCOUNTER — Encounter: Payer: Self-pay | Admitting: Family Medicine

## 2022-04-15 NOTE — Assessment & Plan Note (Signed)
Chronic.  No recent falls or fractures.  Prefers to stay on calcium and vitamin D supplementation.   Last DEXA scan in 2016 Plan to discuss repeat DEXA scan with patient at annual visit, if worsening osteoporosis can revisit discussion on bisphosphonates or other options for treatment.

## 2022-04-15 NOTE — Assessment & Plan Note (Signed)
Chronic.  Asymptomatic.  Last TSH 07/2021.  Normal limits. Continue levothyroxine 112 mcg daily Repeat TSH annually

## 2022-05-02 ENCOUNTER — Other Ambulatory Visit: Payer: Self-pay | Admitting: Family Medicine

## 2022-05-02 DIAGNOSIS — Z1231 Encounter for screening mammogram for malignant neoplasm of breast: Secondary | ICD-10-CM

## 2022-06-02 ENCOUNTER — Other Ambulatory Visit: Payer: Self-pay

## 2022-06-02 ENCOUNTER — Encounter: Payer: PPO | Admitting: Internal Medicine

## 2022-06-02 ENCOUNTER — Encounter: Payer: Self-pay | Admitting: Family Medicine

## 2022-06-02 DIAGNOSIS — E039 Hypothyroidism, unspecified: Secondary | ICD-10-CM

## 2022-06-02 MED ORDER — LEVOTHYROXINE SODIUM 112 MCG PO TABS: 112.0000 ug | ORAL_TABLET | Freq: Every day | ORAL | 3 refills | Status: DC

## 2022-06-12 ENCOUNTER — Ambulatory Visit
Admission: RE | Admit: 2022-06-12 | Discharge: 2022-06-12 | Disposition: A | Discharge status: Home / Self Care | Payer: PPO | Source: Ambulatory Visit | Attending: Family Medicine | Admitting: Family Medicine

## 2022-06-12 DIAGNOSIS — Z1231 Encounter for screening mammogram for malignant neoplasm of breast: Secondary | ICD-10-CM | POA: Diagnosis not present

## 2022-06-28 DIAGNOSIS — E038 Other specified hypothyroidism: Secondary | ICD-10-CM | POA: Diagnosis not present

## 2022-06-28 DIAGNOSIS — Z0000 Encounter for general adult medical examination without abnormal findings: Secondary | ICD-10-CM | POA: Diagnosis not present

## 2022-06-29 LAB — COMPREHENSIVE METABOLIC PANEL
ALT: 12 IU/L (ref 0–32)
AST: 22 IU/L (ref 0–40)
Albumin/Globulin Ratio: 1.8 (ref 1.2–2.2)
Albumin: 4.5 g/dL (ref 3.9–4.9)
Alkaline Phosphatase: 109 IU/L (ref 44–121)
BUN/Creatinine Ratio: 20 (ref 12–28)
BUN: 19 mg/dL (ref 8–27)
Bilirubin Total: 0.9 mg/dL (ref 0.0–1.2)
CO2: 25 mmol/L (ref 20–29)
Calcium: 9.7 mg/dL (ref 8.7–10.3)
Chloride: 104 mmol/L (ref 96–106)
Creatinine, Ser: 0.97 mg/dL (ref 0.57–1.00)
Globulin, Total: 2.5 g/dL (ref 1.5–4.5)
Glucose: 92 mg/dL (ref 70–99)
Potassium: 4.8 mmol/L (ref 3.5–5.2)
Sodium: 141 mmol/L (ref 134–144)
Total Protein: 7 g/dL (ref 6.0–8.5)
eGFR: 64 mL/min/{1.73_m2} (ref >59)

## 2022-06-29 LAB — CBC WITH DIFFERENTIAL/PLATELET
Basophils Absolute: 0.1 10*3/uL (ref 0.0–0.2)
Basos: 1 %
EOS (ABSOLUTE): 0.2 10*3/uL (ref 0.0–0.4)
Eos: 5 %
Hematocrit: 43.7 % (ref 34.0–46.6)
Hemoglobin: 14.1 g/dL (ref 11.1–15.9)
Immature Grans (Abs): 0 10*3/uL (ref 0.0–0.1)
Immature Granulocytes: 0 %
Lymphocytes Absolute: 1.9 10*3/uL (ref 0.7–3.1)
Lymphs: 46 %
MCH: 29.9 pg (ref 26.6–33.0)
MCHC: 32.3 g/dL (ref 31.5–35.7)
MCV: 93 fL (ref 79–97)
Monocytes Absolute: 0.5 10*3/uL (ref 0.1–0.9)
Monocytes: 12 %
Neutrophils Absolute: 1.5 10*3/uL (ref 1.4–7.0)
Neutrophils: 36 %
Platelets: 249 10*3/uL (ref 150–450)
RBC: 4.72 x10E6/uL (ref 3.77–5.28)
RDW: 11.9 % (ref 11.7–15.4)
WBC: 4.2 10*3/uL (ref 3.4–10.8)

## 2022-06-29 LAB — HEMOGLOBIN A1C
Est. average glucose Bld gHb Est-mCnc: 120 mg/dL
Hgb A1c MFr Bld: 5.8 % — ABNORMAL HIGH (ref 4.8–5.6)

## 2022-06-29 LAB — LIPID PANEL
Chol/HDL Ratio: 2.8 ratio (ref 0.0–4.4)
Cholesterol, Total: 221 mg/dL — ABNORMAL HIGH (ref 100–199)
HDL: 79 mg/dL (ref >39)
LDL Chol Calc (NIH): 128 mg/dL — ABNORMAL HIGH (ref 0–99)
Triglycerides: 83 mg/dL (ref 0–149)
VLDL Cholesterol Cal: 14 mg/dL (ref 5–40)

## 2022-06-29 LAB — VITAMIN D 25 HYDROXY (VIT D DEFICIENCY, FRACTURES): Vit D, 25-Hydroxy: 32.7 ng/mL (ref 30.0–100.0)

## 2022-06-29 LAB — VITAMIN B12: Vitamin B-12: 519 pg/mL (ref 232–1245)

## 2022-06-29 LAB — TSH: TSH: 2.31 u[IU]/mL (ref 0.450–4.500)

## 2022-06-29 LAB — MAGNESIUM: Magnesium: 2 mg/dL (ref 1.6–2.3)

## 2022-07-01 ENCOUNTER — Other Ambulatory Visit: Payer: Self-pay | Admitting: Family Medicine

## 2022-07-01 DIAGNOSIS — E559 Vitamin D deficiency, unspecified: Secondary | ICD-10-CM

## 2022-07-01 MED ORDER — VITAMIN D (ERGOCALCIFEROL) 1.25 MG (50000 UNIT) PO CAPS: 50000.0000 [IU] | ORAL_CAPSULE | ORAL | 0 refills | Status: DC

## 2022-07-10 ENCOUNTER — Encounter: Payer: Self-pay | Admitting: Family Medicine

## 2022-07-10 ENCOUNTER — Ambulatory Visit (INDEPENDENT_AMBULATORY_CARE_PROVIDER_SITE_OTHER): Payer: PPO | Admitting: Family Medicine

## 2022-07-10 VITALS — BP 123/80 | HR 77 | Temp 98.3°F | Ht 65.0 in | Wt 180.2 lb

## 2022-07-10 DIAGNOSIS — E559 Vitamin D deficiency, unspecified: Secondary | ICD-10-CM

## 2022-07-10 DIAGNOSIS — E782 Mixed hyperlipidemia: Secondary | ICD-10-CM | POA: Diagnosis not present

## 2022-07-10 DIAGNOSIS — E038 Other specified hypothyroidism: Secondary | ICD-10-CM

## 2022-07-10 DIAGNOSIS — Z0000 Encounter for general adult medical examination without abnormal findings: Secondary | ICD-10-CM | POA: Diagnosis not present

## 2022-07-10 DIAGNOSIS — M810 Age-related osteoporosis without current pathological fracture: Secondary | ICD-10-CM

## 2022-07-10 NOTE — Progress Notes (Unsigned)
SUBJECTIVE:   Chief Complaint  Patient presents with   Annual Exam   HPI She presents to clinic for annual exam and discussion of recent blood work.  No acute concerns today  Hyperlipidemia Not currently on statin therapy.  Recent LDL 128, Total Chol 221.  Has recently started to modify diet and increasing exercise on elliptical to 15 mins day.  Also started planks and now up to 15 mg daily.   Hypothyroid Asymptomatic.  Compliant with current medication and tolerating well.  No recent falls  PERTINENT PMH / PSH: Hypothyroidism HLD  Review of Systems - General ROS: negative    OBJECTIVE:  BP 123/80   Pulse 77   Temp 98.3 F (36.8 C) (Oral)   Ht '5\' 5"'$  (1.651 m)   Wt 180 lb 3.2 oz (81.7 kg)   LMP 05/15/2005   SpO2 98%   BMI 29.99 kg/m    Physical Exam Vitals reviewed.  Constitutional:      General: She is not in acute distress.    Appearance: She is not ill-appearing.  HENT:     Head: Normocephalic.     Right Ear: Tympanic membrane, ear canal and external ear normal.     Left Ear: Tympanic membrane, ear canal and external ear normal.     Nose: Nose normal.     Mouth/Throat:     Mouth: Mucous membranes are moist.  Eyes:     Extraocular Movements: Extraocular movements intact.     Conjunctiva/sclera: Conjunctivae normal.     Pupils: Pupils are equal, round, and reactive to light.  Neck:     Thyroid: No thyromegaly or thyroid tenderness.     Vascular: No carotid bruit.  Cardiovascular:     Rate and Rhythm: Normal rate and regular rhythm.     Pulses: Normal pulses.     Heart sounds: Normal heart sounds.  Pulmonary:     Effort: Pulmonary effort is normal.     Breath sounds: Normal breath sounds.  Abdominal:     General: Bowel sounds are normal. There is no distension.     Palpations: Abdomen is soft.     Tenderness: There is no abdominal tenderness. There is no right CVA tenderness, left CVA tenderness, guarding or rebound.  Musculoskeletal:         General: Normal range of motion.     Cervical back: Normal range of motion.     Right lower leg: No edema.     Left lower leg: No edema.  Lymphadenopathy:     Cervical: No cervical adenopathy.  Skin:    Capillary Refill: Capillary refill takes less than 2 seconds.  Neurological:     General: No focal deficit present.     Mental Status: She is alert and oriented to person, place, and time. Mental status is at baseline.     Motor: No weakness.  Psychiatric:        Mood and Affect: Mood normal.        Behavior: Behavior normal.        Thought Content: Thought content normal.        Judgment: Judgment normal.     ASSESSMENT/PLAN:  Annual physical exam Assessment & Plan: HCM Mammogram up to date,  Due 01/26 Cologuard up to date.  Due 03/28/25 Dexa scan completed Declined COVID and Tetanus booster  Declined Shingles vaccination Declined Flu  Medicare Annual Wellness due 02/2023, patient to schedule Discussed continued lifestyle modifications to lower cholesterol. Continue increasing activity A1c  prediabetes, education provided on nutrition to lower levels  Follow up in 1 year for annual   Mixed hyperlipidemia -     LDL cholesterol, direct  Vitamin D deficiency -     VITAMIN D 25 Hydroxy (Vit-D Deficiency, Fractures)  Age-related osteoporosis without current pathological fracture Assessment & Plan: Recently started Vitamin D supplements.  Not interested in Bisphosphonates Rechecking Vitamin D levels in 6 months and plan to discuss at that time again   Other specified hypothyroidism Assessment & Plan: Chronic.  Asymptomatic. Recent TSH normal.   Continue levothyroxine 112 mcg daily      PDMP reviewed  Return in about 6 months (around 01/08/2023) for PCP.  Carollee Leitz, MD

## 2022-07-10 NOTE — Patient Instructions (Addendum)
It was a pleasure meeting you today. Thank you for allowing me to take part in your health care.  Our goals for today as we discussed include:  Continue current exercise and monitor cholesterol intake to lower cholesterol  Repeat in 6 months  Recommend Shingles vaccine.  This is a 2 dose series and can be given at your local pharmacy.  Please talk to your pharmacist about this.   Recommend Tetanus Vaccination.  This is given every 10 years.   Recommend Pneumonia 20 vaccine. Can be given during clinic visit or at pharmacy.  If you have any questions or concerns, please do not hesitate to call the office at [phone].  I look forward to our next visit and until then take care and stay safe.  Regards,   Carollee Leitz, MD   [facility]

## 2022-07-12 ENCOUNTER — Encounter: Payer: Self-pay | Admitting: Family Medicine

## 2022-07-12 DIAGNOSIS — E559 Vitamin D deficiency, unspecified: Secondary | ICD-10-CM | POA: Insufficient documentation

## 2022-07-12 DIAGNOSIS — E782 Mixed hyperlipidemia: Secondary | ICD-10-CM | POA: Insufficient documentation

## 2022-07-12 NOTE — Assessment & Plan Note (Signed)
HCM Mammogram up to date,  Due 01/26 Cologuard up to date.  Due 03/28/25 Dexa scan completed Declined COVID and Tetanus booster  Declined Shingles vaccination Declined Flu  Medicare Annual Wellness due 02/2023, patient to schedule Discussed continued lifestyle modifications to lower cholesterol. Continue increasing activity A1c prediabetes, education provided on nutrition to lower levels  Follow up in 1 year for annual

## 2022-07-12 NOTE — Assessment & Plan Note (Signed)
Recently started Vitamin D supplements.  Not interested in Bisphosphonates Rechecking Vitamin D levels in 6 months and plan to discuss at that time again

## 2022-07-12 NOTE — Assessment & Plan Note (Signed)
Chronic.  Asymptomatic. Recent TSH normal.   Continue levothyroxine 112 mcg daily

## 2022-09-18 ENCOUNTER — Telehealth: Payer: Self-pay

## 2022-09-18 NOTE — Patient Outreach (Signed)
  Care Coordination   Initial Visit Note   09/18/2022 Name: Sherri Lucas MRN: [id number] DOB: 1955-06-16  Sherri Lucas is a 67 y.o. year old female who sees Dana Allan, MD for primary care. I spoke with  Timmie Foerster by phone today.  What matters to the patients health and wellness today?  Patient states her LDL's are elevated. She states her doctor is allowing her to work on decreasing LDL's with diet and exercise.  Patient states she eating oatmeal daily for breakfast. She states she is exercising on the elliptical  15 minutes per day and walking her stairs numerous times per day.  Patient denies having any nursing or community resource needs at this time.    Goals Addressed             This Visit's Progress    care coordination activities - no further follow up needed.       Interventions Today    Flowsheet Row Most Recent Value  Chronic Disease   Chronic disease during today's visit Other  [Hyperlipidemia]  General Interventions   General Interventions Discussed/Reviewed General Interventions Discussed  [care coordination services discussed,  SDOH survey completed. AWV discussed. Reviewed upcoming provider visits. Assessed dietary changes and increase in physical activity.  Advised to contact PCP if care coordination services needed in future.]              SDOH assessments and interventions completed:  Yes  SDOH Interventions Today    Flowsheet Row Most Recent Value  SDOH Interventions   Food Insecurity Interventions Intervention Not Indicated  Housing Interventions Intervention Not Indicated  Transportation Interventions Intervention Not Indicated        Care Coordination Interventions:  Yes, provided   Follow up plan: No further intervention required.   Encounter Outcome:  Pt. Visit Completed   George Ina RN,BSN,CCM [facility] Care Coordination [phone] direct line

## 2022-12-13 ENCOUNTER — Encounter (INDEPENDENT_AMBULATORY_CARE_PROVIDER_SITE_OTHER): Payer: Self-pay

## 2022-12-26 ENCOUNTER — Encounter: Payer: Self-pay | Admitting: Family Medicine

## 2023-01-03 DIAGNOSIS — E782 Mixed hyperlipidemia: Secondary | ICD-10-CM | POA: Diagnosis not present

## 2023-01-03 DIAGNOSIS — E559 Vitamin D deficiency, unspecified: Secondary | ICD-10-CM | POA: Diagnosis not present

## 2023-01-08 ENCOUNTER — Encounter: Payer: Self-pay | Admitting: Family Medicine

## 2023-01-08 ENCOUNTER — Ambulatory Visit (INDEPENDENT_AMBULATORY_CARE_PROVIDER_SITE_OTHER): Payer: PPO | Admitting: Family Medicine

## 2023-01-08 VITALS — BP 136/76 | HR 76 | Temp 97.9°F | Resp 16 | Ht 67.0 in | Wt 181.5 lb

## 2023-01-08 DIAGNOSIS — E559 Vitamin D deficiency, unspecified: Secondary | ICD-10-CM

## 2023-01-08 DIAGNOSIS — E782 Mixed hyperlipidemia: Secondary | ICD-10-CM | POA: Diagnosis not present

## 2023-01-08 DIAGNOSIS — M810 Age-related osteoporosis without current pathological fracture: Secondary | ICD-10-CM | POA: Diagnosis not present

## 2023-01-08 NOTE — Progress Notes (Signed)
SUBJECTIVE:   Chief Complaint  Patient presents with   Medical Management of Chronic Issues   HPI She presents to clinic for annual exam and discussion of recent blood work.  No acute concerns today  Hyperlipidemia Recent recheck of direct LDL 130.  Has increased from previous check.  Discussed risk with patient and offered Calcium score.    The 10-year ASCVD risk score (Arnett DK, et al., 2019) is: 8.1%  Not interested in pursuing with imaging at this time or starting statin.  Wants to work on lifestyle modifications and if no improvement in next 3-6 months willing to consider imaging or statin.   Vitamin D level improved Can continue with daily supplements.  PERTINENT PMH / PSH: Hypothyroidism HLD  OBJECTIVE:  BP 136/76   Pulse 76   Temp 97.9 F (36.6 C)   Resp 16   Ht 5\' 7"  (1.702 m)   Wt 181 lb 8 oz (82.3 kg)   LMP 05/15/2005   SpO2 97%   BMI 28.43 kg/m    Physical Exam Vitals reviewed.  Constitutional:      General: She is not in acute distress.    Appearance: She is not ill-appearing.  HENT:     Head: Normocephalic.     Right Ear: Tympanic membrane, ear canal and external ear normal.     Left Ear: Tympanic membrane, ear canal and external ear normal.     Nose: Nose normal.     Mouth/Throat:     Mouth: Mucous membranes are moist.  Eyes:     Extraocular Movements: Extraocular movements intact.     Conjunctiva/sclera: Conjunctivae normal.     Pupils: Pupils are equal, round, and reactive to light.  Neck:     Thyroid: No thyromegaly or thyroid tenderness.     Vascular: No carotid bruit.  Cardiovascular:     Rate and Rhythm: Normal rate and regular rhythm.     Pulses: Normal pulses.     Heart sounds: Normal heart sounds.  Pulmonary:     Effort: Pulmonary effort is normal.     Breath sounds: Normal breath sounds.  Abdominal:     General: Bowel sounds are normal. There is no distension.     Palpations: Abdomen is soft.     Tenderness: There is no  abdominal tenderness. There is no right CVA tenderness, left CVA tenderness, guarding or rebound.  Musculoskeletal:        General: Normal range of motion.     Cervical back: Normal range of motion.     Right lower leg: No edema.     Left lower leg: No edema.  Lymphadenopathy:     Cervical: No cervical adenopathy.  Skin:    Capillary Refill: Capillary refill takes less than 2 seconds.  Neurological:     General: No focal deficit present.     Mental Status: She is alert and oriented to person, place, and time. Mental status is at baseline.     Motor: No weakness.  Psychiatric:        Mood and Affect: Mood normal.        Behavior: Behavior normal.        Thought Content: Thought content normal.        Judgment: Judgment normal.     ASSESSMENT/PLAN:  Mixed hyperlipidemia Assessment & Plan: Chronic Not currently on statin ASCVD risk intermediate Discussed initiation of statin therapy, patient declines at this time Offered Calcium score, declined at this time Plans for lifestyle  modification and if no improvement in 3-6 months she will consider above recommendations Limit processed foods Increase activity as planned Follow up as scheduled    Vitamin D deficiency Assessment & Plan: Vitamin D wnl Continue daily supplements given history of osteoporosis   Age-related osteoporosis without current pathological fracture Assessment & Plan: Not interested in Bisphosphonate treatment Recommend resistance exercises Recommend Calcium 1200 mg daily and continue current Vitamin D supplements      PDMP reviewed  Return in about 6 months (around 07/11/2023).  Dana Allan, MD

## 2023-01-08 NOTE — Patient Instructions (Signed)
It was a pleasure meeting you today. Thank you for allowing me to take part in your health care.  Our goals for today as we discussed include:  The 10-year ASCVD risk score (Arnett DK, et al., 2019) is: 8.1%   Values used to calculate the score:     Age: 67 years     Sex: Female     Is Non-Hispanic African American: No     Diabetic: No     Tobacco smoker: No     Systolic Blood Pressure: 136 mmHg     Is BP treated: No     HDL Cholesterol: 79 mg/dL     Total Cholesterol: 221 mg/dL   Recommend statin therapy   Continue to modify diet and increase exercise Follow up in 6 months    If you have any questions or concerns, please do not hesitate to call the office at [phone].  I look forward to our next visit and until then take care and stay safe.  Regards,   Dana Allan, MD   [facility]

## 2023-01-21 ENCOUNTER — Encounter: Payer: Self-pay | Admitting: Family Medicine

## 2023-01-21 NOTE — Assessment & Plan Note (Signed)
Chronic Not currently on statin ASCVD risk intermediate Discussed initiation of statin therapy, patient declines at this time Offered Calcium score, declined at this time Plans for lifestyle modification and if no improvement in 3-6 months she will consider above recommendations Limit processed foods Increase activity as planned Follow up as scheduled

## 2023-01-21 NOTE — Assessment & Plan Note (Signed)
Not interested in Bisphosphonate treatment Recommend resistance exercises Recommend Calcium 1200 mg daily and continue current Vitamin D supplements

## 2023-01-21 NOTE — Assessment & Plan Note (Addendum)
Vitamin D wnl Continue daily supplements given history of osteoporosis

## 2023-02-26 ENCOUNTER — Ambulatory Visit (INDEPENDENT_AMBULATORY_CARE_PROVIDER_SITE_OTHER): Payer: PPO | Admitting: *Deleted

## 2023-02-26 VITALS — Ht 67.0 in | Wt 180.0 lb

## 2023-02-26 DIAGNOSIS — Z0000 Encounter for general adult medical examination without abnormal findings: Secondary | ICD-10-CM | POA: Diagnosis not present

## 2023-02-26 NOTE — Patient Instructions (Signed)
Sherri Lucas , Thank you for taking time to come for your Medicare Wellness Visit. I appreciate your ongoing commitment to your health goals. Please review the following plan we discussed and let me know if I can assist you in the future.   Referrals/Orders/Follow-Ups/Clinician Recommendations: Consider to get vaccines updated   This is a list of the screening recommended for you and due dates:  Health Maintenance  Topic Date Due   Zoster (Shingles) Vaccine (1 of 2) Never done   DTaP/Tdap/Td vaccine (2 - Td or Tdap) 04/29/2019   Pneumonia Vaccine (1 of 1 - PCV) Never done   COVID-19 Vaccine (4 - 2023-24 season) 01/14/2023   Flu Shot  08/13/2023*   Medicare Annual Wellness Visit  02/26/2024   Mammogram  06/12/2024   Cologuard (Stool DNA test)  03/28/2025   DEXA scan (bone density measurement)  Completed   Hepatitis C Screening  Completed   HPV Vaccine  Aged Out   Colon Cancer Screening  Discontinued  *Topic was postponed. The date shown is not the original due date.    Advanced directives: (Copy Requested) Please bring a copy of your health care power of attorney and living will to the office to be added to your chart at your convenience.  Next Medicare Annual Wellness Visit scheduled for next year: Yes 02/27/24 @ 9:40

## 2023-02-26 NOTE — Progress Notes (Signed)
Subjective:   Sherri Lucas is a 67 y.o. female who presents for Medicare Annual (Subsequent) preventive examination.  Visit Complete: Virtual I connected with  Sherri Lucas on 02/26/23 by a audio enabled telemedicine application and verified that I am speaking with the correct person using two identifiers.  Patient Location: Home  Provider Location: Office/Clinic  I discussed the limitations of evaluation and management by telemedicine. The patient expressed understanding and agreed to proceed.  Vital Signs: Because this visit was a virtual/telehealth visit, some criteria may be missing or patient reported. Any vitals not documented were not able to be obtained and vitals that have been documented are patient reported.  Patient Medicare AWV questionnaire was completed by the patient on 02/22/23; I have confirmed that all information answered by patient is correct and no changes since this date.  Cardiac Risk Factors include: advanced age (>53men, >30 women);dyslipidemia     Objective:    Today's Vitals   02/26/23 0857  Weight: 180 lb (81.6 kg)  Height: 5\' 7"  (1.702 m)   Body mass index is 28.19 kg/m.     02/26/2023    9:11 AM 02/22/2022    9:52 AM  Advanced Directives  Does Patient Have a Medical Advance Directive? Yes Yes  Type of Estate agent of [place];Living will Healthcare Power of Attorney  Does patient want to make changes to medical advance directive?  No - Patient declined  Copy of Healthcare Power of Attorney in Chart? No - copy requested     Current Medications (verified) Outpatient Encounter Medications as of 02/26/2023  Medication Sig   Boswellia-Glucosamine-Vit D (OSTEO BI-FLEX ONE PER DAY PO) Take by mouth daily.   Cholecalciferol (VITAMIN D) 50 MCG (2000 UT) tablet Take 2,000 Units by mouth daily.    Coenzyme Q10 (COQ10) 100 MG CAPS Take 100 mg by mouth daily.   COLLAGEN PO Take by mouth.   fluticasone (FLONASE) 50  MCG/ACT nasal spray Place into both nostrils daily.   levothyroxine (SYNTHROID) 112 MCG tablet Take 1 tablet (112 mcg total) by mouth daily before breakfast.   Magnesium 250 MG TABS Take 250 mg by mouth daily.   Omega-3 Fatty Acids (SUPER OMEGA 3 EPA/DHA) 1000 MG CAPS Take by mouth.   Strontium Chloride POWD by Does not apply route.   Vitamin D, Ergocalciferol, (DRISDOL) 1.25 MG (50000 UNIT) CAPS capsule Take 1 capsule (50,000 Units total) by mouth every 7 (seven) days. (Patient not taking: Reported on 02/26/2023)   No facility-administered encounter medications on file as of 02/26/2023.    Allergies (verified) Patient has no known allergies.   History: Past Medical History:  Diagnosis Date   Left knee pain    Nosebleed 10/25/2019   Posterior vitreous degeneration, right    Thyroid disease    History reviewed. No pertinent surgical history. Family History  Problem Relation Age of Onset   Diabetes Mother    Cancer Father        prostate   Cancer Sister        breast   Breast cancer Sister 13   Stroke Brother    Social History   Socioeconomic History   Marital status: Widowed    Spouse name: Not on file   Number of children: Not on file   Years of education: Not on file   Highest education level: Not on file  Occupational History   Not on file  Tobacco Use   Smoking status: Never   Smokeless  tobacco: Never  Substance and Sexual Activity   Alcohol use: No   Drug use: No   Sexual activity: Yes    Birth control/protection: Post-menopausal  Other Topics Concern   Not on file  Social History Narrative   Married to husband x 20 yrs    Husband died 2019-07-01      3 step kids 50 y.o son lives with her    [employer] employee    6 sisters, 1 brother deceased 9 y.o suicide    Plans to retire 01/2021   Social Determinants of Health   Financial Resource Strain: Low Risk  (02/22/2023)   Overall Financial Resource Strain (CARDIA)    Difficulty of Paying Living Expenses: Not  hard at all  Food Insecurity: No Food Insecurity (02/22/2023)   Hunger Vital Sign    Worried About Running Out of Food in the Last Year: Never true    Ran Out of Food in the Last Year: Never true  Transportation Needs: No Transportation Needs (02/22/2023)   PRAPARE - Administrator, Civil Service (Medical): No    Lack of Transportation (Non-Medical): No  Physical Activity: Insufficiently Active (02/22/2023)   Exercise Vital Sign    Days of Exercise per Week: 3 days    Minutes of Exercise per Session: 10 min  Stress: No Stress Concern Present (02/22/2023)   [employer] of Occupational Health - Occupational Stress Questionnaire    Feeling of Stress : Not at all  Social Connections: Socially Isolated (02/22/2023)   Social Connection and Isolation Panel [NHANES]    Frequency of Communication with Friends and Family: Twice a week    Frequency of Social Gatherings with Friends and Family: More than three times a week    Attends Religious Services: Never    Database administrator or Organizations: No    Attends Banker Meetings: Never    Marital Status: Widowed    Tobacco Counseling Counseling given: Not Answered   Clinical Intake:  Pre-visit preparation completed: Yes  Pain : No/denies pain     BMI - recorded: 28.19 Nutritional Risks: None Diabetes: No  How often do you need to have someone help you when you read instructions, pamphlets, or other written materials from your doctor or pharmacy?: 1 - Never  Interpreter Needed?: No  Information entered by :: R. Damein Gaunce LPN   Activities of Daily Living    02/22/2023    8:46 AM  In your present state of health, do you have any difficulty performing the following activities:  Hearing? 0  Vision? 0  Difficulty concentrating or making decisions? 0  Walking or climbing stairs? 0  Dressing or bathing? 0  Doing errands, shopping? 0  Preparing Food and eating ? N  Using the Toilet? N  In the past  six months, have you accidently leaked urine? N  Do you have problems with loss of bowel control? N  Managing your Medications? N  Managing your Finances? N  Housekeeping or managing your Housekeeping? N    Patient Care Team: Dana Allan, MD as PCP - General (Family Medicine)  Indicate any recent Medical Services you may have received from other than [facility] providers in the past year (date may be approximate).     Assessment:   This is a routine wellness examination for Ellene.  Hearing/Vision screen Hearing Screening - Comments:: No issues  Vision Screening - Comments:: glasses   Goals Addressed  This Visit's Progress    Patient Stated       Wants to lose some weight       Depression Screen    02/26/2023    9:07 AM 01/08/2023   11:18 AM 07/10/2022   10:28 AM 03/29/2022    1:14 PM 02/22/2022    9:40 AM 06/01/2021    1:06 PM 05/06/2020    1:14 PM  PHQ 2/9 Scores  PHQ - 2 Score 0 0 0 0 0 0 1  PHQ- 9 Score 0 0     5    Fall Risk    02/22/2023    8:46 AM 07/10/2022   10:28 AM 03/29/2022    1:14 PM 02/22/2022    9:43 AM 06/01/2021    1:06 PM  Fall Risk   Falls in the past year? 0 0 0 0 0  Number falls in past yr: 0 0 0 0 0  Injury with Fall? 0 0 0 0 0  Risk for fall due to : No Fall Risks No Fall Risks No Fall Risks No Fall Risks No Fall Risks  Follow up Falls prevention discussed;Falls evaluation completed Falls evaluation completed Falls evaluation completed Falls evaluation completed Falls evaluation completed    MEDICARE RISK AT HOME: Medicare Risk at Home Any stairs in or around the home?: Yes If so, are there any without handrails?: No Home free of loose throw rugs in walkways, pet beds, electrical cords, etc?: Yes Adequate lighting in your home to reduce risk of falls?: Yes Life alert?: No Use of a cane, walker or w/c?: No Grab bars in the bathroom?: No Shower chair or bench in shower?: No Elevated toilet seat or a handicapped toilet?:  No   Cognitive Function:        02/26/2023    9:13 AM 02/22/2022    9:57 AM  6CIT Screen  What Year? 0 points 0 points  What month? 0 points 0 points  What time? 0 points 0 points  Count back from 20 0 points 0 points  Months in reverse 0 points 0 points  Repeat phrase 0 points 0 points  Total Score 0 points 0 points    Immunizations Immunization History  Administered Date(s) Administered   PFIZER Comirnaty(Gray Top)Covid-19 Tri-Sucrose Vaccine 09/23/2020   PFIZER(Purple Top)SARS-COV-2 Vaccination 01/24/2020, 02/14/2020   Tdap 04/28/2009    TDAP status: Due, Education has been provided regarding the importance of this vaccine. Advised may receive this vaccine at local pharmacy or Health Dept. Aware to provide a copy of the vaccination record if obtained from local pharmacy or Health Dept. Verbalized acceptance and understanding.  Flu Vaccine status: Declined, Education has been provided regarding the importance of this vaccine but patient still declined. Advised may receive this vaccine at local pharmacy or Health Dept. Aware to provide a copy of the vaccination record if obtained from local pharmacy or Health Dept. Verbalized acceptance and understanding.  Pneumococcal vaccine status: Declined,  Education has been provided regarding the importance of this vaccine but patient still declined. Advised may receive this vaccine at local pharmacy or Health Dept. Aware to provide a copy of the vaccination record if obtained from local pharmacy or Health Dept. Verbalized acceptance and understanding.   Covid-19 vaccine status: Information provided on how to obtain vaccines.   Qualifies for Shingles Vaccine? Yes   Zostavax completed No   Shingrix Completed?: No.    Education has been provided regarding the importance of this vaccine. Patient has been advised  to call insurance company to determine out of pocket expense if they have not yet received this vaccine. Advised may also receive  vaccine at local pharmacy or Health Dept. Verbalized acceptance and understanding.  Screening Tests Health Maintenance  Topic Date Due   Zoster Vaccines- Shingrix (1 of 2) Never done   DTaP/Tdap/Td (2 - Td or Tdap) 04/29/2019   Pneumonia Vaccine 70+ Years old (1 of 1 - PCV) Never done   COVID-19 Vaccine (4 - 2023-24 season) 01/14/2023   Medicare Annual Wellness (AWV)  02/23/2023   INFLUENZA VACCINE  08/13/2023 (Originally 12/14/2022)   MAMMOGRAM  06/12/2024   Fecal DNA (Cologuard)  03/28/2025   DEXA SCAN  Completed   Hepatitis C Screening  Completed   HPV VACCINES  Aged Out   Colonoscopy  Discontinued    Health Maintenance  Health Maintenance Due  Topic Date Due   Zoster Vaccines- Shingrix (1 of 2) Never done   DTaP/Tdap/Td (2 - Td or Tdap) 04/29/2019   Pneumonia Vaccine 53+ Years old (1 of 1 - PCV) Never done   COVID-19 Vaccine (4 - 2023-24 season) 01/14/2023   Medicare Annual Wellness (AWV)  02/23/2023    Colorectal cancer screening: Type of screening: Cologuard. Completed 03/2022. Repeat every 3 years  Mammogram status: Completed 05/2022. Repeat every year  Bone Density status: Completed 03/2015. Results reflect: Bone density results: OSTEOPOROSIS. Repeat every 2 years. Patient declines  Lung Cancer Screening: (Low Dose CT Chest recommended if Age 54-80 years, 20 pack-year currently smoking OR have quit w/in 15years.) does not qualify.     Additional Screening:  Hepatitis C Screening: does qualify; Completed 04/2011  Vision Screening: Recommended annual ophthalmology exams for early detection of glaucoma and other disorders of the eye. Is the patient up to date with their annual eye exam?  Yes  Who is the provider or what is the name of the office in which the patient attends annual eye exams? [facility] If pt is not established with a provider, would they like to be referred to a provider to establish care? No .   Dental Screening: Recommended annual dental exams  for proper oral hygiene    Community Resource Referral / Chronic Care Management: CRR required this visit?  No   CCM required this visit?  No     Plan:     I have personally reviewed and noted the following in the patient's chart:   Medical and social history Use of alcohol, tobacco or illicit drugs  Current medications and supplements including opioid prescriptions. Patient is not currently taking opioid prescriptions. Functional ability and status Nutritional status Physical activity Advanced directives List of other physicians Hospitalizations, surgeries, and ER visits in previous 12 months Vitals Screenings to include cognitive, depression, and falls Referrals and appointments  In addition, I have reviewed and discussed with patient certain preventive protocols, quality metrics, and best practice recommendations. A written personalized care plan for preventive services as well as general preventive health recommendations were provided to patient.     Sydell Axon, LPN   30/16/0109   After Visit Summary: (MyChart) Due to this being a telephonic visit, the after visit summary with patients personalized plan was offered to patient via MyChart   Nurse Notes: None

## 2023-04-23 ENCOUNTER — Other Ambulatory Visit: Payer: Self-pay | Admitting: Family Medicine

## 2023-04-23 DIAGNOSIS — Z1231 Encounter for screening mammogram for malignant neoplasm of breast: Secondary | ICD-10-CM

## 2023-05-29 ENCOUNTER — Other Ambulatory Visit: Payer: Self-pay | Admitting: Family Medicine

## 2023-05-29 DIAGNOSIS — E039 Hypothyroidism, unspecified: Secondary | ICD-10-CM

## 2023-06-14 ENCOUNTER — Ambulatory Visit
Admission: RE | Admit: 2023-06-14 | Discharge: 2023-06-14 | Disposition: A | Discharge status: Home / Self Care | Payer: PPO | Source: Ambulatory Visit | Attending: Family Medicine | Admitting: Family Medicine

## 2023-06-14 DIAGNOSIS — Z1231 Encounter for screening mammogram for malignant neoplasm of breast: Secondary | ICD-10-CM | POA: Diagnosis not present

## 2023-07-05 ENCOUNTER — Encounter: Payer: Self-pay | Admitting: Family Medicine

## 2023-07-05 NOTE — Telephone Encounter (Signed)
 Good morning  You can get blood work here the day of your appointment if you would like. Please make sure you are fasting 10 hours before your appointment.  If you have any further questions feel free to send [place] a message or cal the office at [phone].  Tresa Endo, CMA

## 2023-07-12 ENCOUNTER — Ambulatory Visit (INDEPENDENT_AMBULATORY_CARE_PROVIDER_SITE_OTHER): Payer: PPO | Admitting: Family Medicine

## 2023-07-12 ENCOUNTER — Encounter: Payer: Self-pay | Admitting: Family Medicine

## 2023-07-12 VITALS — BP 134/76 | HR 82 | Temp 98.0°F | Resp 18 | Ht 67.0 in | Wt 187.4 lb

## 2023-07-12 DIAGNOSIS — E782 Mixed hyperlipidemia: Secondary | ICD-10-CM | POA: Diagnosis not present

## 2023-07-12 DIAGNOSIS — Z0000 Encounter for general adult medical examination without abnormal findings: Secondary | ICD-10-CM

## 2023-07-12 DIAGNOSIS — J310 Chronic rhinitis: Secondary | ICD-10-CM | POA: Diagnosis not present

## 2023-07-12 DIAGNOSIS — E038 Other specified hypothyroidism: Secondary | ICD-10-CM

## 2023-07-12 DIAGNOSIS — R7309 Other abnormal glucose: Secondary | ICD-10-CM

## 2023-07-12 LAB — COMPREHENSIVE METABOLIC PANEL
ALT: 13 U/L (ref 0–35)
AST: 22 U/L (ref 0–37)
Albumin: 4.4 g/dL (ref 3.5–5.2)
Alkaline Phosphatase: 88 U/L (ref 39–117)
BUN: 26 mg/dL — ABNORMAL HIGH (ref 6–23)
CO2: 30 meq/L (ref 19–32)
Calcium: 9.6 mg/dL (ref 8.4–10.5)
Chloride: 103 meq/L (ref 96–112)
Creatinine, Ser: 0.95 mg/dL (ref 0.40–1.20)
GFR: 61.43 mL/min (ref >60.00)
Glucose, Bld: 95 mg/dL (ref 70–99)
Potassium: 3.9 meq/L (ref 3.5–5.1)
Sodium: 139 meq/L (ref 135–145)
Total Bilirubin: 1 mg/dL (ref 0.2–1.2)
Total Protein: 7.6 g/dL (ref 6.0–8.3)

## 2023-07-12 LAB — HEMOGLOBIN A1C: Hgb A1c MFr Bld: 5.8 % (ref 4.6–6.5)

## 2023-07-12 LAB — LIPID PANEL
Cholesterol: 239 mg/dL — ABNORMAL HIGH (ref 0–200)
HDL: 79 mg/dL (ref >39.00)
LDL Cholesterol: 141 mg/dL — ABNORMAL HIGH (ref 0–99)
NonHDL: 160.02
Total CHOL/HDL Ratio: 3
Triglycerides: 96 mg/dL (ref 0.0–149.0)
VLDL: 19.2 mg/dL (ref 0.0–40.0)

## 2023-07-12 LAB — TSH: TSH: 9.14 u[IU]/mL — ABNORMAL HIGH (ref 0.35–5.50)

## 2023-07-12 NOTE — Patient Instructions (Addendum)
 It was a pleasure meeting you today. Thank you for allowing me to take part in your health care.  Our goals for today as we discussed include:  We will get some labs today.  If they are abnormal or we need to do something about them, I will call you.  If they are normal, I will send you a message on MyChart (if it is active) or a letter in the mail.  If you don't hear from [place] in 2 weeks, please call the office at the number below.   Calcium score/ Cardiac CT.  This is a 3D image of the heart that measures the calcium deposits in the coronary arteries to determine risk of heart disease. It is a $99 self-pay exam . The imaging is done at [facility].    This is a list of the screening recommended for you and due dates:  Health Maintenance  Topic Date Due   Zoster (Shingles) Vaccine (1 of 2) Never done   DTaP/Tdap/Td vaccine (2 - Td or Tdap) 04/29/2019   Pneumonia Vaccine (1 of 1 - PCV) Never done   COVID-19 Vaccine (4 - 2024-25 season) 01/14/2023   Flu Shot  08/13/2023*   Medicare Annual Wellness Visit  02/26/2024   Mammogram  06/13/2024   Cologuard (Stool DNA test)  03/28/2025   DEXA scan (bone density measurement)  Completed   Hepatitis C Screening  Completed   HPV Vaccine  Aged Out   Colon Cancer Screening  Discontinued  *Topic was postponed. The date shown is not the original due date.     If you have any questions or concerns, please do not hesitate to call the office at [phone].  I look forward to our next visit and until then take care and stay safe.  Regards,   Dana Allan, MD   [facility]

## 2023-07-12 NOTE — Progress Notes (Signed)
 SUBJECTIVE:   Chief Complaint  Patient presents with   Annual Exam   HPI Presents for annual physical  Discussed the use of AI scribe software for clinical note transcription with the patient, who gave verbal consent to proceed.  History of Present Illness Sherri Lucas is a 68 year old female who presents for an annual physical exam.  She is fasting today in preparation for blood work, which she usually has done prior to her visit to check her LDL levels. Her colonoscopy is up to date with a Cologuard test, and her mammogram is also current. She is concerned about the cost of a permanent crown she is getting, even with Medicare coverage.  She has been eating oatmeal for breakfast every morning for six months to see if it would impact her cholesterol levels, although her cholesterol is not extremely high. She has been doing better with her diet, avoiding sweets and snacks, and limiting red meat to once a week while incorporating salmon, tilapia, and chicken into her meals.  She is part of a clinical trial where she is currently walking two hours a week, split into four 30-minute sessions, and doing 15 to 20 minutes of strength training on those days. She has not noticed a difference in her balance.  She has been experiencing allergy-type problems, including a stuffy head. She is not using Flonase but plans to start Xyzal at night. She also experiences chronic rhinitis and occasional dizziness, which she attributes to her sinuses. No chest pain, shortness of breath, or belly pain. Occasional dizziness, a runny nose, and chronic rhinitis are present.  Her Synthroid prescription was recently refilled, and she has enough refills until January next year.  Her stepson, who has ADD and bipolar disorder, is on medications and smokes pot, which she believes does not help with his medications. She cooks dinner four to five times a week to ensure he eats a decent meal.    PERTINENT PMH /  PSH: As above  OBJECTIVE:  BP 134/76   Pulse 82   Temp 98 F (36.7 C)   Resp 18   Ht 5\' 7"  (1.702 m)   Wt 187 lb 6 oz (85 kg)   LMP 05/15/2005   SpO2 99%   BMI 29.35 kg/m    Physical Exam Vitals reviewed.  Constitutional:      General: She is not in acute distress.    Appearance: She is not ill-appearing.  HENT:     Head: Normocephalic.     Right Ear: Tympanic membrane, ear canal and external ear normal.     Left Ear: Tympanic membrane, ear canal and external ear normal.     Nose: Nose normal.     Mouth/Throat:     Mouth: Mucous membranes are moist.  Eyes:     Extraocular Movements: Extraocular movements intact.     Conjunctiva/sclera: Conjunctivae normal.     Pupils: Pupils are equal, round, and reactive to light.  Neck:     Thyroid: No thyromegaly or thyroid tenderness.     Vascular: No carotid bruit.  Cardiovascular:     Rate and Rhythm: Normal rate and regular rhythm.     Pulses: Normal pulses.     Heart sounds: Normal heart sounds.  Pulmonary:     Effort: Pulmonary effort is normal.     Breath sounds: Normal breath sounds.  Abdominal:     General: Bowel sounds are normal. There is no distension.     Palpations: Abdomen  is soft.     Tenderness: There is no abdominal tenderness. There is no right CVA tenderness, left CVA tenderness, guarding or rebound.  Musculoskeletal:        General: Normal range of motion.     Cervical back: Normal range of motion.     Right lower leg: No edema.     Left lower leg: No edema.  Lymphadenopathy:     Cervical: No cervical adenopathy.  Skin:    Capillary Refill: Capillary refill takes less than 2 seconds.  Neurological:     General: No focal deficit present.     Mental Status: She is alert and oriented to person, place, and time. Mental status is at baseline.     Motor: No weakness.  Psychiatric:        Mood and Affect: Mood normal.        Behavior: Behavior normal.        Thought Content: Thought content normal.         Judgment: Judgment normal.           07/12/2023   11:10 AM 02/26/2023    9:07 AM 01/08/2023   11:18 AM 07/10/2022   10:28 AM 03/29/2022    1:14 PM  Depression screen PHQ 2/9  Decreased Interest 0 0 0 0 0  Down, Depressed, Hopeless 0 0 0 0 0  PHQ - 2 Score 0 0 0 0 0  Altered sleeping 0 0 0    Tired, decreased energy 0 0 0    Change in appetite 0 0 0    Feeling bad or failure about yourself  0 0 0    Trouble concentrating 0 0 0    Moving slowly or fidgety/restless 0 0 0    Suicidal thoughts 0 0 0    PHQ-9 Score 0 0 0    Difficult doing work/chores Not difficult at all Not difficult at all Not difficult at all        07/12/2023   11:11 AM 01/08/2023   11:18 AM 07/10/2022   10:28 AM 05/06/2020    1:14 PM  GAD 7 : Generalized Anxiety Score  Nervous, Anxious, on Edge 0 0 0 1  Control/stop worrying 0 0 0 1  Worry too much - different things 0 0 0 1  Trouble relaxing 0 0 0 1  Restless 0 0 0 0  Easily annoyed or irritable 0 0 0 1  Afraid - awful might happen 0 0 0 1  Total GAD 7 Score 0 0 0 6  Anxiety Difficulty Not difficult at all Not difficult at all Not difficult at all Not difficult at all    ASSESSMENT/PLAN:  Annual physical exam Assessment & Plan: Mammogram up to date,  Due 01/26 Cologuard up to date.  Due 03/28/25 Dexa scan completed Declined COVID and Tetanus booster  Declined Shingles vaccination Declined Flu  Medicare Annual Wellness due 02/2023, patient to schedule Discussed continued lifestyle modifications to lower cholesterol. Continue increasing activity A1c prediabetes, education provided on nutrition to lower levels  Follow up in 1 year for annual   Other specified hypothyroidism Assessment & Plan: Patient is currently on Synthroid with refills until January next year. -Check thyroid function tests today to ensure appropriate dosing. -Continue Levothyroxine 112 mcg daily   Orders: -     TSH -     TSH; Future  Mixed  hyperlipidemia Assessment & Plan: Patient has been making dietary changes and participating in a clinical trial involving  exercise. LDL levels have been slightly elevated in the past. Patient is hesitant to start statin therapy due to potential side effects. -Order lipid panel today while patient is fasting. -Discuss results and potential need for statin therapy or cardiac CT scan depending on LDL levels.  Orders: -     Lipid panel -     Comprehensive metabolic panel  Abnormal glucose -     Hemoglobin A1c  Chronic rhinitis Assessment & Plan: Patient reports chronic rhinitis symptoms, possibly exacerbated by exposure to secondhand marijuana smoke. -Continue current management.    Sent message to notify patient of results. TSH increased Notify patient.  If taking incorrectly resume same dose and check TSH in 4 weeks If taking correctly will increase Levothyroxine from 112 mcg to 137 mcg daily  LDL has increased.  Intermediate risk, 8.3% ASCVD. Recommend Crestor 10 mg daily.  Consider Calcium score as discussed in clinic.  PDMP reviewed  Return if symptoms worsen or fail to improve, for PCP.  Dana Allan, MD

## 2023-07-20 ENCOUNTER — Encounter: Payer: Self-pay | Admitting: Family Medicine

## 2023-07-20 DIAGNOSIS — J310 Chronic rhinitis: Secondary | ICD-10-CM | POA: Insufficient documentation

## 2023-07-20 NOTE — Assessment & Plan Note (Signed)
 Patient has been making dietary changes and participating in a clinical trial involving exercise. LDL levels have been slightly elevated in the past. Patient is hesitant to start statin therapy due to potential side effects. -Order lipid panel today while patient is fasting. -Discuss results and potential need for statin therapy or cardiac CT scan depending on LDL levels.

## 2023-07-20 NOTE — Assessment & Plan Note (Signed)
 Patient reports chronic rhinitis symptoms, possibly exacerbated by exposure to secondhand marijuana smoke. -Continue current management.

## 2023-07-20 NOTE — Assessment & Plan Note (Signed)
 Patient is currently on Synthroid with refills until January next year. -Check thyroid function tests today to ensure appropriate dosing. -Continue Levothyroxine 112 mcg daily

## 2023-07-20 NOTE — Assessment & Plan Note (Signed)
 Mammogram up to date,  Due 01/26 Cologuard up to date.  Due 03/28/25 Dexa scan completed Declined COVID and Tetanus booster  Declined Shingles vaccination Declined Flu  Medicare Annual Wellness due 02/2023, patient to schedule Discussed continued lifestyle modifications to lower cholesterol. Continue increasing activity A1c prediabetes, education provided on nutrition to lower levels  Follow up in 1 year for annual

## 2023-08-22 ENCOUNTER — Other Ambulatory Visit (INDEPENDENT_AMBULATORY_CARE_PROVIDER_SITE_OTHER)

## 2023-08-22 DIAGNOSIS — E038 Other specified hypothyroidism: Secondary | ICD-10-CM

## 2023-08-22 LAB — TSH: TSH: 3.38 u[IU]/mL (ref 0.35–5.50)

## 2023-08-28 ENCOUNTER — Telehealth: Payer: Self-pay

## 2023-08-28 NOTE — Telephone Encounter (Signed)
 Copied from CRM [id number]. Topic: Appointments - Transfer of Care >> Aug 28, 2023 10:07 AM Clyde Darling P wrote: Pt is requesting to transfer FROM: [facility] Pt is requesting to transfer TO: [facility] Dr. Deborra Falter Reason for requested transfer: [facility] It is the responsibility of the team the patient would like to transfer to (Dr. Malissa Se) to reach out to the patient if for any reason this transfer is not acceptable.

## 2023-08-28 NOTE — Telephone Encounter (Signed)
 Can you see what's going on with this? Did she wants to transfer here from [facility] or stay there? The new appt is not until 2026?

## 2023-08-30 NOTE — Telephone Encounter (Signed)
 Can patient be called and see if she would like to set her [facility] for sooner then June 2026? I knew her CPE isnt due until then but she may still need care before her next cpe

## 2023-09-03 NOTE — Telephone Encounter (Signed)
 Spoke with patient and she stated that she will hold off moving up appt. She wants to see if [place] will get a new MD first before she move it up. She will cb at a later date.

## 2023-09-03 NOTE — Telephone Encounter (Signed)
 Noted.

## 2023-12-03 ENCOUNTER — Other Ambulatory Visit: Payer: Self-pay | Admitting: Medical Genetics

## 2024-02-27 ENCOUNTER — Ambulatory Visit: Payer: PPO | Admitting: *Deleted

## 2024-02-27 VITALS — Ht 67.0 in | Wt 190.0 lb

## 2024-02-27 DIAGNOSIS — Z0000 Encounter for general adult medical examination without abnormal findings: Secondary | ICD-10-CM | POA: Diagnosis not present

## 2024-02-27 NOTE — Progress Notes (Signed)
 Subjective:   Sherri Lucas is a 68 y.o. who presents for a Medicare Wellness preventive visit.  As a reminder, Annual Wellness Visits don't include a physical exam, and some assessments may be limited, especially if this visit is performed virtually. We may recommend an in-person follow-up visit with your provider if needed.  Visit Complete: Virtual I connected with  Sherri Lucas on 02/27/24 by a audio enabled telemedicine application and verified that I am speaking with the correct person using two identifiers.  Patient Location: Home  Provider Location: Home Office  I discussed the limitations of evaluation and management by telemedicine. The patient expressed understanding and agreed to proceed.  Vital Signs: Because this visit was a virtual/telehealth visit, some criteria may be missing or patient reported. Any vitals not documented were not able to be obtained and vitals that have been documented are patient reported.  VideoDeclined- This patient declined Librarian, academic. Therefore the visit was completed with audio only.  Persons Participating in Visit: Patient.  AWV Questionnaire: Yes: Patient Medicare AWV questionnaire was completed by the patient on 02/23/24; I have confirmed that all information answered by patient is correct and no changes since this date.  Cardiac Risk Factors include: advanced age (>45men, >60 women);dyslipidemia     Objective:    Today's Vitals   02/27/24 0926  Weight: 190 lb (86.2 kg)  Height: 5' 7 (1.702 m)   Body mass index is 29.76 kg/m.     02/27/2024    9:36 AM 02/26/2023    9:11 AM 02/22/2022    9:52 AM  Advanced Directives  Does Patient Have a Medical Advance Directive? No Yes Yes  Type of Special educational needs teacher of [place];Living will Healthcare Power of Attorney  Does patient want to make changes to medical advance directive?   No - Patient declined  Copy of Healthcare Power  of Attorney in Chart?  No - copy requested   Would patient like information on creating a medical advance directive? No - Patient declined      Current Medications (verified) Outpatient Encounter Medications as of 02/27/2024  Medication Sig   Boswellia-Glucosamine-Vit D (OSTEO BI-FLEX ONE PER DAY PO) Take by mouth daily.   Cholecalciferol (VITAMIN D ) 50 MCG (2000 UT) tablet Take 2,000 Units by mouth daily.    Coenzyme Q10 (COQ10) 100 MG CAPS Take 100 mg by mouth daily.   COLLAGEN PO Take by mouth.   levothyroxine  (SYNTHROID ) 112 MCG tablet TAKE 1 TABLET BY MOUTH DAILY BEFORE BREAKFAST.   Magnesium 250 MG TABS Take 250 mg by mouth daily.   Omega-3 Fatty Acids (SUPER OMEGA 3 EPA/DHA) 1000 MG CAPS Take by mouth.   Strontium Chloride POWD by Does not apply route.   Vitamin D , Ergocalciferol , (DRISDOL ) 1.25 MG (50000 UNIT) CAPS capsule Take 1 capsule (50,000 Units total) by mouth every 7 (seven) days. (Patient not taking: Reported on 02/27/2024)   No facility-administered encounter medications on file as of 02/27/2024.    Allergies (verified) Patient has no known allergies.   History: Past Medical History:  Diagnosis Date   Allergy    Left knee pain    Nosebleed 10/25/2019   Posterior vitreous degeneration, right    Thyroid  disease    Past Surgical History:  Procedure Laterality Date   FRACTURE SURGERY  10/30/07, 05/02/13   Left wrist, Right wrist   Family History  Problem Relation Age of Onset   Diabetes Mother    Cancer Father  prostate   Cancer Sister        breast   Breast cancer Sister 74   Stroke Brother    Social History   Socioeconomic History   Marital status: Widowed    Spouse name: Not on file   Number of children: Not on file   Years of education: Not on file   Highest education level: Bachelor's degree (e.g., BA, AB, BS)  Occupational History   Not on file  Tobacco Use   Smoking status: Never   Smokeless tobacco: Never  Substance and Sexual  Activity   Alcohol use: No   Drug use: No   Sexual activity: Not Currently    Birth control/protection: Post-menopausal  Other Topics Concern   Not on file  Social History Narrative   Married to husband x 20 yrs    Husband died Jul 04, 2019      3 step kids 50 y.o son lives with her    [employer] employee    6 sisters, 1 brother deceased 29 y.o suicide    Plans to retire 01/2021   Social Drivers of Health   Financial Resource Strain: Low Risk  (02/23/2024)   Overall Financial Resource Strain (CARDIA)    Difficulty of Paying Living Expenses: Not hard at all  Food Insecurity: No Food Insecurity (02/23/2024)   Hunger Vital Sign    Worried About Running Out of Food in the Last Year: Never true    Ran Out of Food in the Last Year: Never true  Transportation Needs: No Transportation Needs (02/23/2024)   PRAPARE - Administrator, Civil Service (Medical): No    Lack of Transportation (Non-Medical): No  Physical Activity: Insufficiently Active (02/23/2024)   Exercise Vital Sign    Days of Exercise per Week: 3 days    Minutes of Exercise per Session: 30 min  Stress: No Stress Concern Present (02/23/2024)   [employer] of Occupational Health - Occupational Stress Questionnaire    Feeling of Stress: Not at all  Social Connections: Moderately Isolated (02/23/2024)   Social Connection and Isolation Panel    Frequency of Communication with Friends and Family: More than three times a week    Frequency of Social Gatherings with Friends and Family: More than three times a week    Attends Religious Services: More than 4 times per year    Active Member of [employer] or Organizations: No    Attends Banker Meetings: Never    Marital Status: Widowed    Tobacco Counseling Counseling given: Not Answered    Clinical Intake:  Pre-visit preparation completed: Yes  Pain : No/denies pain     BMI - recorded: 29.76 Nutritional Status: BMI 25 -29 Overweight Nutritional  Risks: None Diabetes: No  Lab Results  Component Value Date   HGBA1C 5.8 07/12/2023   HGBA1C 5.8 (H) 06/28/2022   HGBA1C 6.0 (H) 07/19/2021     How often do you need to have someone help you when you read instructions, pamphlets, or other written materials from your doctor or pharmacy?: 1 - Never  Interpreter Needed?: No  Information entered by :: R. Luan Maberry LPN   Activities of Daily Living     02/23/2024    9:41 AM  In your present state of health, do you have any difficulty performing the following activities:  Hearing? 0  Vision? 0  Difficulty concentrating or making decisions? 0  Walking or climbing stairs? 0  Dressing or bathing? 0  Doing errands, shopping? 0  Preparing Food and eating ? N  Using the Toilet? N  In the past six months, have you accidently leaked urine? N  Do you have problems with loss of bowel control? N  Managing your Medications? N  Managing your Finances? N  Housekeeping or managing your Housekeeping? N    No care team member to display  I have updated your Care Teams any recent Medical Services you may have received from other providers in the past year.     Assessment:   This is a routine wellness examination for Sherri Lucas.  Hearing/Vision screen Hearing Screening - Comments:: No issues Vision Screening - Comments:: glasses   Goals Addressed             This Visit's Progress    Patient Stated       Wants to lose weight       Depression Screen     02/27/2024    9:31 AM 07/12/2023   11:10 AM 02/26/2023    9:07 AM 01/08/2023   11:18 AM 07/10/2022   10:28 AM 03/29/2022    1:14 PM 02/22/2022    9:40 AM  PHQ 2/9 Scores  PHQ - 2 Score 1 0 0 0 0 0 0  PHQ- 9 Score 2 0 0 0       Fall Risk     02/23/2024    9:41 AM 07/12/2023   11:10 AM 02/22/2023    8:46 AM 07/10/2022   10:28 AM 03/29/2022    1:14 PM  Fall Risk   Falls in the past year? 0 0 0 0 0  Number falls in past yr: 0 0 0 0 0  Injury with Fall? 0 0 0 0 0  Risk for  fall due to : No Fall Risks No Fall Risks No Fall Risks No Fall Risks No Fall Risks  Follow up Falls evaluation completed;Falls prevention discussed Falls evaluation completed Falls prevention discussed;Falls evaluation completed Falls evaluation completed Falls evaluation completed      Data saved with a previous flowsheet row definition    MEDICARE RISK AT HOME:  Medicare Risk at Home Any stairs in or around the home?: (Patient-Rptd) Yes If so, are there any without handrails?: (Patient-Rptd) No Home free of loose throw rugs in walkways, pet beds, electrical cords, etc?: (Patient-Rptd) Yes Adequate lighting in your home to reduce risk of falls?: (Patient-Rptd) Yes Life alert?: (Patient-Rptd) No Use of a cane, walker or w/c?: (Patient-Rptd) No Grab bars in the bathroom?: (Patient-Rptd) No Shower chair or bench in shower?: (Patient-Rptd) No Elevated toilet seat or a handicapped toilet?: (Patient-Rptd) No  TIMED UP AND GO:  Was the test performed?  No  Cognitive Function: 6CIT completed        02/27/2024    9:37 AM 02/26/2023    9:13 AM 02/22/2022    9:57 AM  6CIT Screen  What Year? 0 points 0 points 0 points  What month? 0 points 0 points 0 points  What time? 0 points 0 points 0 points  Count back from 20 0 points 0 points 0 points  Months in reverse 0 points 0 points 0 points  Repeat phrase 0 points 0 points 0 points  Total Score 0 points 0 points 0 points    Immunizations Immunization History  Administered Date(s) Administered   PFIZER Comirnaty(Gray Top)Covid-19 Tri-Sucrose Vaccine 09/23/2020   PFIZER(Purple Top)SARS-COV-2 Vaccination 01/24/2020, 02/14/2020   Tdap 04/28/2009    Screening Tests Health Maintenance  Topic Date Due  Pneumococcal Vaccine: 50+ Years (1 of 1 - PCV) Never done   Zoster Vaccines- Shingrix (1 of 2) Never done   DTaP/Tdap/Td (2 - Td or Tdap) 04/29/2019   Influenza Vaccine  Never done   COVID-19 Vaccine (4 - 2025-26 season) 01/14/2024    Medicare Annual Wellness (AWV)  02/26/2024   Mammogram  06/13/2024   Fecal DNA (Cologuard)  03/28/2025   DEXA SCAN  Completed   Hepatitis C Screening  Completed   Meningococcal B Vaccine  Aged Out   Colonoscopy  Discontinued    Health Maintenance Items Addressed: Patient declines vaccines and Dexa. Patient stated that she wants to hold off on an order being placed for her mammogram until she does her [facility] visit March 2026 with her new PCP.   Additional Screening:  Vision Screening: Recommended annual ophthalmology exams for early detection of glaucoma and other disorders of the eye. Is the patient up to date with their annual eye exam?  Yes  Who is the provider or what is the name of the office in which the patient attends annual eye exams?  [facility]  Dental Screening: Recommended annual dental exams for proper oral hygiene  Community Resource Referral / Chronic Care Management: CRR required this visit?  No   CCM required this visit?  No   Plan:    I have personally reviewed and noted the following in the patient's chart:   Medical and social history Use of alcohol, tobacco or illicit drugs  Current medications and supplements including opioid prescriptions. Patient is not currently taking opioid prescriptions. Functional ability and status Nutritional status Physical activity Advanced directives List of other physicians Hospitalizations, surgeries, and ER visits in previous 12 months Vitals Screenings to include cognitive, depression, and falls Referrals and appointments  In addition, I have reviewed and discussed with patient certain preventive protocols, quality metrics, and best practice recommendations. A written personalized care plan for preventive services as well as general preventive health recommendations were provided to patient.   Angeline Fredericks, LPN   [id number]   After Visit Summary: (MyChart) Due to this being a telephonic visit, the after visit  summary with patients personalized plan was offered to patient via MyChart   Notes: Nothing significant to report at this time.

## 2024-02-27 NOTE — Patient Instructions (Addendum)
 [name],  Thank you for taking the time for your Medicare Wellness Visit. I appreciate your continued commitment to your health goals. Please review the care plan we discussed, and feel free to reach out if I can assist you further.  Medicare recommends these wellness visits once per year to help you and your care team stay ahead of potential health issues. These visits are designed to focus on prevention, allowing your provider to concentrate on managing your acute and chronic conditions during your regular appointments.  Please note that Annual Wellness Visits do not include a physical exam. Some assessments may be limited, especially if the visit was conducted virtually. If needed, we may recommend a separate in-person follow-up with your provider.  Consider updating your vaccines.  Ongoing Care Seeing your primary care provider every 3 to 6 months helps us  monitor your health and provide consistent, personalized care. Consider updating your vaccines  Referrals If a referral was made during today's visit and you haven't received any updates within two weeks, please contact the referred provider directly to check on the status.  Recommended Screenings:  Health Maintenance  Topic Date Due   Pneumococcal Vaccine for age over 39 (1 of 1 - PCV) Never done   Zoster (Shingles) Vaccine (1 of 2) Never done   DTaP/Tdap/Td vaccine (2 - Td or Tdap) 04/29/2019   Flu Shot  Never done   COVID-19 Vaccine (4 - 2025-26 season) 01/14/2024   Breast Cancer Screening  06/13/2024   Medicare Annual Wellness Visit  02/26/2025   Cologuard (Stool DNA test)  03/28/2025   DEXA scan (bone density measurement)  Completed   Hepatitis C Screening  Completed   Meningitis B Vaccine  Aged Out   Colon Cancer Screening  Discontinued       02/27/2024    9:36 AM  Advanced Directives  Does Patient Have a Medical Advance Directive? No  Would patient like information on creating a medical advance directive? No -  Patient declined   Advance Care Planning is important because it: Ensures you receive medical care that aligns with your values, goals, and preferences. Provides guidance to your family and loved ones, reducing the emotional burden of decision-making during critical moments.  Vision: Annual vision screenings are recommended for early detection of glaucoma, cataracts, and diabetic retinopathy. These exams can also reveal signs of chronic conditions such as diabetes and high blood pressure.  Dental: Annual dental screenings help detect early signs of oral cancer, gum disease, and other conditions linked to overall health, including heart disease and diabetes.  Please see the attached documents for additional preventive care recommendations.

## 2024-05-19 DIAGNOSIS — Z1231 Encounter for screening mammogram for malignant neoplasm of breast: Secondary | ICD-10-CM

## 2024-05-19 DIAGNOSIS — E039 Hypothyroidism, unspecified: Secondary | ICD-10-CM

## 2024-05-20 MED ORDER — LEVOTHYROXINE SODIUM 112 MCG PO TABS: 112.0000 ug | ORAL_TABLET | Freq: Every day | ORAL | 0 refills | Status: AC

## 2024-05-20 NOTE — Telephone Encounter (Signed)
 Please let the patient know I have ordered screening mammogram, she can get this done anytime after 06/13/24. I also recommend continuing current Levothyroxine  112 mcg daily. We will repeat labs during her visit with me and determine if dose adjustment needs to be made.  1. Hypothyroidism, unspecified type - levothyroxine  (SYNTHROID ) 112 MCG tablet; Take 1 tablet (112 mcg total) by mouth daily before breakfast.  Dispense: 90 tablet; Refill: 0  2. Encounter for screening mammogram for breast cancer (Primary) - MM 3D SCREENING MAMMOGRAM BILATERAL BREAST; Future   Thank you, Luke Shade, MD

## 2024-06-16 ENCOUNTER — Encounter

## 2024-06-25 ENCOUNTER — Encounter

## 2024-07-15 ENCOUNTER — Encounter: Payer: PPO | Admitting: Family Medicine

## 2024-07-15 ENCOUNTER — Encounter: Admitting: General Practice

## 2024-07-22 ENCOUNTER — Encounter

## 2025-02-27 ENCOUNTER — Ambulatory Visit
